# Patient Record
Sex: Male | Born: 1945 | Race: White | Hispanic: No | Smoking: Current every day smoker
Health system: Southern US, Community
[De-identification: ages and names within clinical notes are randomized; demographics above are authoritative.]

## PROBLEM LIST (undated history)

## (undated) DIAGNOSIS — J449 Chronic obstructive pulmonary disease, unspecified: Secondary | ICD-10-CM

## (undated) DIAGNOSIS — I10 Essential (primary) hypertension: Secondary | ICD-10-CM

## (undated) HISTORY — PX: APPENDECTOMY: SHX54

## (undated) HISTORY — PX: NECK SURGERY: SHX720

## (undated) HISTORY — PX: HEMORRHOID SURGERY: SHX153

---

## 2020-01-16 DIAGNOSIS — R7303 Prediabetes: Secondary | ICD-10-CM | POA: Diagnosis not present

## 2020-01-16 DIAGNOSIS — I209 Angina pectoris, unspecified: Secondary | ICD-10-CM | POA: Diagnosis not present

## 2020-01-16 DIAGNOSIS — J449 Chronic obstructive pulmonary disease, unspecified: Secondary | ICD-10-CM | POA: Diagnosis not present

## 2020-01-16 DIAGNOSIS — I10 Essential (primary) hypertension: Secondary | ICD-10-CM | POA: Diagnosis not present

## 2020-01-20 DIAGNOSIS — I10 Essential (primary) hypertension: Secondary | ICD-10-CM | POA: Insufficient documentation

## 2020-01-20 DIAGNOSIS — E785 Hyperlipidemia, unspecified: Secondary | ICD-10-CM | POA: Diagnosis not present

## 2020-01-20 DIAGNOSIS — I25118 Atherosclerotic heart disease of native coronary artery with other forms of angina pectoris: Secondary | ICD-10-CM | POA: Diagnosis not present

## 2020-01-20 DIAGNOSIS — Z720 Tobacco use: Secondary | ICD-10-CM | POA: Diagnosis not present

## 2020-01-24 DIAGNOSIS — R079 Chest pain, unspecified: Secondary | ICD-10-CM | POA: Diagnosis not present

## 2020-01-24 DIAGNOSIS — Z8249 Family history of ischemic heart disease and other diseases of the circulatory system: Secondary | ICD-10-CM | POA: Diagnosis not present

## 2020-01-24 DIAGNOSIS — I25118 Atherosclerotic heart disease of native coronary artery with other forms of angina pectoris: Secondary | ICD-10-CM | POA: Diagnosis not present

## 2020-02-06 DIAGNOSIS — Z8249 Family history of ischemic heart disease and other diseases of the circulatory system: Secondary | ICD-10-CM | POA: Diagnosis not present

## 2020-02-06 DIAGNOSIS — I25118 Atherosclerotic heart disease of native coronary artery with other forms of angina pectoris: Secondary | ICD-10-CM | POA: Diagnosis not present

## 2020-02-06 DIAGNOSIS — R0602 Shortness of breath: Secondary | ICD-10-CM | POA: Diagnosis not present

## 2020-02-06 DIAGNOSIS — I10 Essential (primary) hypertension: Secondary | ICD-10-CM | POA: Diagnosis not present

## 2020-02-17 DIAGNOSIS — E785 Hyperlipidemia, unspecified: Secondary | ICD-10-CM | POA: Diagnosis not present

## 2020-02-17 DIAGNOSIS — I25118 Atherosclerotic heart disease of native coronary artery with other forms of angina pectoris: Secondary | ICD-10-CM | POA: Diagnosis not present

## 2020-02-17 DIAGNOSIS — I10 Essential (primary) hypertension: Secondary | ICD-10-CM | POA: Diagnosis not present

## 2020-04-22 DIAGNOSIS — Z0000 Encounter for general adult medical examination without abnormal findings: Secondary | ICD-10-CM | POA: Diagnosis not present

## 2020-04-22 DIAGNOSIS — I209 Angina pectoris, unspecified: Secondary | ICD-10-CM | POA: Diagnosis not present

## 2020-04-22 DIAGNOSIS — I10 Essential (primary) hypertension: Secondary | ICD-10-CM | POA: Diagnosis not present

## 2020-04-22 DIAGNOSIS — J449 Chronic obstructive pulmonary disease, unspecified: Secondary | ICD-10-CM | POA: Diagnosis not present

## 2020-04-28 DIAGNOSIS — Z122 Encounter for screening for malignant neoplasm of respiratory organs: Secondary | ICD-10-CM | POA: Diagnosis not present

## 2020-04-28 DIAGNOSIS — F17210 Nicotine dependence, cigarettes, uncomplicated: Secondary | ICD-10-CM | POA: Diagnosis not present

## 2020-05-15 DIAGNOSIS — R911 Solitary pulmonary nodule: Secondary | ICD-10-CM | POA: Diagnosis not present

## 2020-05-15 DIAGNOSIS — J410 Simple chronic bronchitis: Secondary | ICD-10-CM | POA: Diagnosis not present

## 2020-05-15 DIAGNOSIS — J45909 Unspecified asthma, uncomplicated: Secondary | ICD-10-CM | POA: Insufficient documentation

## 2020-05-15 DIAGNOSIS — Z720 Tobacco use: Secondary | ICD-10-CM | POA: Diagnosis not present

## 2020-05-27 DIAGNOSIS — J410 Simple chronic bronchitis: Secondary | ICD-10-CM | POA: Insufficient documentation

## 2020-07-23 DIAGNOSIS — Z0000 Encounter for general adult medical examination without abnormal findings: Secondary | ICD-10-CM | POA: Diagnosis not present

## 2020-07-23 DIAGNOSIS — I10 Essential (primary) hypertension: Secondary | ICD-10-CM | POA: Diagnosis not present

## 2020-07-23 DIAGNOSIS — E782 Mixed hyperlipidemia: Secondary | ICD-10-CM | POA: Diagnosis not present

## 2020-07-23 DIAGNOSIS — J449 Chronic obstructive pulmonary disease, unspecified: Secondary | ICD-10-CM | POA: Diagnosis not present

## 2020-07-25 DIAGNOSIS — I10 Essential (primary) hypertension: Secondary | ICD-10-CM | POA: Diagnosis not present

## 2020-07-25 DIAGNOSIS — J449 Chronic obstructive pulmonary disease, unspecified: Secondary | ICD-10-CM | POA: Diagnosis not present

## 2020-07-25 DIAGNOSIS — E782 Mixed hyperlipidemia: Secondary | ICD-10-CM | POA: Diagnosis not present

## 2020-07-31 DIAGNOSIS — J439 Emphysema, unspecified: Secondary | ICD-10-CM | POA: Diagnosis not present

## 2020-07-31 DIAGNOSIS — J432 Centrilobular emphysema: Secondary | ICD-10-CM | POA: Diagnosis not present

## 2020-07-31 DIAGNOSIS — Z720 Tobacco use: Secondary | ICD-10-CM | POA: Diagnosis not present

## 2020-07-31 DIAGNOSIS — I2510 Atherosclerotic heart disease of native coronary artery without angina pectoris: Secondary | ICD-10-CM | POA: Diagnosis not present

## 2020-07-31 DIAGNOSIS — R911 Solitary pulmonary nodule: Secondary | ICD-10-CM | POA: Diagnosis not present

## 2020-07-31 DIAGNOSIS — I700 Atherosclerosis of aorta: Secondary | ICD-10-CM | POA: Diagnosis not present

## 2020-08-04 DIAGNOSIS — R911 Solitary pulmonary nodule: Secondary | ICD-10-CM | POA: Diagnosis not present

## 2020-08-04 DIAGNOSIS — Z720 Tobacco use: Secondary | ICD-10-CM | POA: Diagnosis not present

## 2020-08-04 DIAGNOSIS — U070 Vaping-related disorder: Secondary | ICD-10-CM | POA: Diagnosis not present

## 2020-08-06 DIAGNOSIS — H2513 Age-related nuclear cataract, bilateral: Secondary | ICD-10-CM | POA: Diagnosis not present

## 2020-08-24 DIAGNOSIS — J449 Chronic obstructive pulmonary disease, unspecified: Secondary | ICD-10-CM | POA: Diagnosis not present

## 2020-08-24 DIAGNOSIS — E782 Mixed hyperlipidemia: Secondary | ICD-10-CM | POA: Diagnosis not present

## 2020-08-24 DIAGNOSIS — I10 Essential (primary) hypertension: Secondary | ICD-10-CM | POA: Diagnosis not present

## 2020-09-01 DIAGNOSIS — Z720 Tobacco use: Secondary | ICD-10-CM | POA: Diagnosis not present

## 2020-09-01 DIAGNOSIS — I25118 Atherosclerotic heart disease of native coronary artery with other forms of angina pectoris: Secondary | ICD-10-CM | POA: Diagnosis not present

## 2020-09-01 DIAGNOSIS — I10 Essential (primary) hypertension: Secondary | ICD-10-CM | POA: Diagnosis not present

## 2020-09-01 DIAGNOSIS — E785 Hyperlipidemia, unspecified: Secondary | ICD-10-CM | POA: Diagnosis not present

## 2020-10-24 DIAGNOSIS — I10 Essential (primary) hypertension: Secondary | ICD-10-CM | POA: Diagnosis not present

## 2020-10-24 DIAGNOSIS — E782 Mixed hyperlipidemia: Secondary | ICD-10-CM | POA: Diagnosis not present

## 2020-10-24 DIAGNOSIS — J449 Chronic obstructive pulmonary disease, unspecified: Secondary | ICD-10-CM | POA: Diagnosis not present

## 2020-11-02 DIAGNOSIS — E782 Mixed hyperlipidemia: Secondary | ICD-10-CM | POA: Diagnosis not present

## 2020-11-02 DIAGNOSIS — R7303 Prediabetes: Secondary | ICD-10-CM | POA: Diagnosis not present

## 2020-11-02 DIAGNOSIS — I10 Essential (primary) hypertension: Secondary | ICD-10-CM | POA: Diagnosis not present

## 2020-11-02 DIAGNOSIS — Z0000 Encounter for general adult medical examination without abnormal findings: Secondary | ICD-10-CM | POA: Diagnosis not present

## 2020-11-02 DIAGNOSIS — J449 Chronic obstructive pulmonary disease, unspecified: Secondary | ICD-10-CM | POA: Diagnosis not present

## 2020-11-23 DIAGNOSIS — E782 Mixed hyperlipidemia: Secondary | ICD-10-CM | POA: Diagnosis not present

## 2020-11-23 DIAGNOSIS — I10 Essential (primary) hypertension: Secondary | ICD-10-CM | POA: Diagnosis not present

## 2020-11-23 DIAGNOSIS — J449 Chronic obstructive pulmonary disease, unspecified: Secondary | ICD-10-CM | POA: Diagnosis not present

## 2020-12-24 DIAGNOSIS — I10 Essential (primary) hypertension: Secondary | ICD-10-CM | POA: Diagnosis not present

## 2020-12-24 DIAGNOSIS — J449 Chronic obstructive pulmonary disease, unspecified: Secondary | ICD-10-CM | POA: Diagnosis not present

## 2020-12-24 DIAGNOSIS — E782 Mixed hyperlipidemia: Secondary | ICD-10-CM | POA: Diagnosis not present

## 2021-01-24 DIAGNOSIS — I10 Essential (primary) hypertension: Secondary | ICD-10-CM | POA: Diagnosis not present

## 2021-01-24 DIAGNOSIS — J449 Chronic obstructive pulmonary disease, unspecified: Secondary | ICD-10-CM | POA: Diagnosis not present

## 2021-01-24 DIAGNOSIS — E782 Mixed hyperlipidemia: Secondary | ICD-10-CM | POA: Diagnosis not present

## 2021-02-01 DIAGNOSIS — R911 Solitary pulmonary nodule: Secondary | ICD-10-CM | POA: Diagnosis not present

## 2021-02-01 DIAGNOSIS — I700 Atherosclerosis of aorta: Secondary | ICD-10-CM | POA: Diagnosis not present

## 2021-02-01 DIAGNOSIS — J410 Simple chronic bronchitis: Secondary | ICD-10-CM | POA: Diagnosis not present

## 2021-02-01 DIAGNOSIS — J439 Emphysema, unspecified: Secondary | ICD-10-CM | POA: Diagnosis not present

## 2021-02-10 DIAGNOSIS — E782 Mixed hyperlipidemia: Secondary | ICD-10-CM | POA: Diagnosis not present

## 2021-02-10 DIAGNOSIS — Z0000 Encounter for general adult medical examination without abnormal findings: Secondary | ICD-10-CM | POA: Diagnosis not present

## 2021-02-10 DIAGNOSIS — J449 Chronic obstructive pulmonary disease, unspecified: Secondary | ICD-10-CM | POA: Diagnosis not present

## 2021-02-10 DIAGNOSIS — I10 Essential (primary) hypertension: Secondary | ICD-10-CM | POA: Diagnosis not present

## 2021-02-10 DIAGNOSIS — I700 Atherosclerosis of aorta: Secondary | ICD-10-CM | POA: Diagnosis not present

## 2021-02-24 DIAGNOSIS — I700 Atherosclerosis of aorta: Secondary | ICD-10-CM | POA: Diagnosis not present

## 2021-02-24 DIAGNOSIS — E782 Mixed hyperlipidemia: Secondary | ICD-10-CM | POA: Diagnosis not present

## 2021-02-24 DIAGNOSIS — I10 Essential (primary) hypertension: Secondary | ICD-10-CM | POA: Diagnosis not present

## 2021-02-24 DIAGNOSIS — J449 Chronic obstructive pulmonary disease, unspecified: Secondary | ICD-10-CM | POA: Diagnosis not present

## 2021-03-26 DIAGNOSIS — I10 Essential (primary) hypertension: Secondary | ICD-10-CM | POA: Diagnosis not present

## 2021-03-26 DIAGNOSIS — J449 Chronic obstructive pulmonary disease, unspecified: Secondary | ICD-10-CM | POA: Diagnosis not present

## 2021-03-26 DIAGNOSIS — E782 Mixed hyperlipidemia: Secondary | ICD-10-CM | POA: Diagnosis not present

## 2021-03-26 DIAGNOSIS — I700 Atherosclerosis of aorta: Secondary | ICD-10-CM | POA: Diagnosis not present

## 2021-04-28 DIAGNOSIS — Z9889 Other specified postprocedural states: Secondary | ICD-10-CM | POA: Diagnosis not present

## 2021-04-28 DIAGNOSIS — M542 Cervicalgia: Secondary | ICD-10-CM | POA: Diagnosis not present

## 2021-05-13 DIAGNOSIS — Z0000 Encounter for general adult medical examination without abnormal findings: Secondary | ICD-10-CM | POA: Diagnosis not present

## 2021-05-13 DIAGNOSIS — S134XXA Sprain of ligaments of cervical spine, initial encounter: Secondary | ICD-10-CM | POA: Diagnosis not present

## 2021-05-13 DIAGNOSIS — J449 Chronic obstructive pulmonary disease, unspecified: Secondary | ICD-10-CM | POA: Diagnosis not present

## 2021-05-13 DIAGNOSIS — E782 Mixed hyperlipidemia: Secondary | ICD-10-CM | POA: Diagnosis not present

## 2021-05-13 DIAGNOSIS — I10 Essential (primary) hypertension: Secondary | ICD-10-CM | POA: Diagnosis not present

## 2021-05-13 DIAGNOSIS — I700 Atherosclerosis of aorta: Secondary | ICD-10-CM | POA: Diagnosis not present

## 2021-05-26 DIAGNOSIS — I700 Atherosclerosis of aorta: Secondary | ICD-10-CM | POA: Diagnosis not present

## 2021-05-26 DIAGNOSIS — E782 Mixed hyperlipidemia: Secondary | ICD-10-CM | POA: Diagnosis not present

## 2021-05-26 DIAGNOSIS — I10 Essential (primary) hypertension: Secondary | ICD-10-CM | POA: Diagnosis not present

## 2021-05-26 DIAGNOSIS — J449 Chronic obstructive pulmonary disease, unspecified: Secondary | ICD-10-CM | POA: Diagnosis not present

## 2021-08-03 DIAGNOSIS — I700 Atherosclerosis of aorta: Secondary | ICD-10-CM | POA: Diagnosis not present

## 2021-08-03 DIAGNOSIS — F17210 Nicotine dependence, cigarettes, uncomplicated: Secondary | ICD-10-CM | POA: Diagnosis not present

## 2021-08-03 DIAGNOSIS — J439 Emphysema, unspecified: Secondary | ICD-10-CM | POA: Diagnosis not present

## 2021-08-03 DIAGNOSIS — R911 Solitary pulmonary nodule: Secondary | ICD-10-CM | POA: Diagnosis not present

## 2021-08-19 DIAGNOSIS — E782 Mixed hyperlipidemia: Secondary | ICD-10-CM | POA: Diagnosis not present

## 2021-08-19 DIAGNOSIS — S134XXA Sprain of ligaments of cervical spine, initial encounter: Secondary | ICD-10-CM | POA: Diagnosis not present

## 2021-08-19 DIAGNOSIS — J449 Chronic obstructive pulmonary disease, unspecified: Secondary | ICD-10-CM | POA: Diagnosis not present

## 2021-08-19 DIAGNOSIS — Z0000 Encounter for general adult medical examination without abnormal findings: Secondary | ICD-10-CM | POA: Diagnosis not present

## 2021-08-19 DIAGNOSIS — I10 Essential (primary) hypertension: Secondary | ICD-10-CM | POA: Diagnosis not present

## 2021-08-19 DIAGNOSIS — I700 Atherosclerosis of aorta: Secondary | ICD-10-CM | POA: Diagnosis not present

## 2021-08-24 DIAGNOSIS — E782 Mixed hyperlipidemia: Secondary | ICD-10-CM | POA: Diagnosis not present

## 2021-08-24 DIAGNOSIS — I10 Essential (primary) hypertension: Secondary | ICD-10-CM | POA: Diagnosis not present

## 2021-08-24 DIAGNOSIS — J449 Chronic obstructive pulmonary disease, unspecified: Secondary | ICD-10-CM | POA: Diagnosis not present

## 2021-08-24 DIAGNOSIS — I700 Atherosclerosis of aorta: Secondary | ICD-10-CM | POA: Diagnosis not present

## 2021-09-16 ENCOUNTER — Encounter: Payer: Self-pay | Admitting: Internal Medicine

## 2021-09-16 ENCOUNTER — Other Ambulatory Visit: Payer: Self-pay

## 2021-09-16 ENCOUNTER — Ambulatory Visit (INDEPENDENT_AMBULATORY_CARE_PROVIDER_SITE_OTHER): Payer: Medicare Other | Admitting: Internal Medicine

## 2021-09-16 DIAGNOSIS — F17210 Nicotine dependence, cigarettes, uncomplicated: Secondary | ICD-10-CM | POA: Diagnosis not present

## 2021-09-16 DIAGNOSIS — J449 Chronic obstructive pulmonary disease, unspecified: Secondary | ICD-10-CM | POA: Diagnosis not present

## 2021-09-16 MED ORDER — AZITHROMYCIN 250 MG PO TABS: ORAL_TABLET | ORAL | 11 refills | Status: DC

## 2021-09-16 NOTE — Assessment & Plan Note (Addendum)
Counseled re importance of smoking cessation but did not meet time criteria for separate billing   ? ?Ct's reviewed > agree with continuing low dose surveillance q 12 m, nothing needed sooner  ? ?Each maintenance medication was reviewed in detail including emphasizing most importantly the difference between maintenance and prns and under what circumstances the prns are to be triggered using an action plan format where appropriate. ? ?Total time for H and P, chart review, counseling, reviewing dpi device(s) , directly observing portions of ambulatory 02 saturation study/ and generating customized AVS unique to this office visit / same day charting = > 45  min for 1st evaluation  ?     ?  ?      ? ? ? ?

## 2021-09-16 NOTE — Progress Notes (Addendum)
? ?Paul Randall, male    DOB: 1945-11-06,    MRN: [id number] ? ? 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  ? ?When moved to [place] weighed 140 s sob  ? ?History of Present Illness  ?09/16/2021  Pulmonary/ 1st office eval/ Paul Randall / [facility]  ?Chief Complaint  ?Patient presents with  ? Consult  ?  Patient had CT done and nodules noted. Brought CT CD   ?Dyspnea:  onset with wt gain - walks across parking lot/ no problem slow pace, main problem is when bend over  - ex tol better p trelegy  ?Cough: some with ex / variably discolored never bloody  ?Sleep: insomnia/ flat bed / 2 pillows  ?SABA use: spiriva respimat > not helpful  ? ?No obvious day to day or daytime variability or assoc excess/ purulent sputum or mucus plugs or hemoptysis or cp or chest tightness, subjective wheeze or overt sinus or hb symptoms.  ? ?Sleeping  without nocturnal  or early am exacerbation  of respiratory  c/o's or need for noct saba. Also denies any obvious fluctuation of symptoms with weather or environmental changes or other aggravating or alleviating factors except as outlined above  ? ?No unusual exposure hx or h/o childhood pna/ asthma or knowledge of premature birth. ? ?Current Allergies, Complete Past Medical History, Past Surgical History, Family History, and Social History were reviewed in [employer] record. ? ?ROS  The following are not active complaints unless bolded ?Hoarseness, sore throat, dysphagia, dental problems, itching, sneezing,  nasal congestion or discharge of excess mucus or purulent secretions, ear ache,   fever, chills, sweats, unintended wt loss or wt gain, classically pleuritic or exertional cp,  orthopnea pnd or arm/hand swelling  or leg swelling, presyncope, palpitations, abdominal pain, anorexia, nausea, vomiting, diarrhea  or change in bowel habits  or change in bladder habits, change in stools or change in urine, dysuria, hematuria,  rash, arthralgias, visual complaints, headache, numbness, weakness or ataxia or problems with walking or coordination,  change in mood or  memory. ?      ?   ? ?No past medical history on file. ? ?Outpatient Medications Prior to Visit  ?Medication Sig Dispense Refill  ? aspirin 81 MG EC tablet Take by mouth.    ? atorvastatin (LIPITOR) 40 MG tablet Take 40 mg by mouth daily.    ? isosorbide mononitrate (IMDUR) 30 MG 24 hr tablet Take 30 mg by mouth daily.    ? loratadine (CLARITIN) 10 MG tablet Take 10 mg by mouth daily.    ? metoprolol succinate (TOPROL-XL) 25 MG 24 hr tablet Take 25 mg by mouth daily.    ? naproxen sodium (ALEVE) 220 MG tablet Take 220 mg by mouth.    ? TRELEGY ELLIPTA 200-62.5-25 MCG/ACT AEPB Inhale 1 puff into the lungs daily.    ? ?No facility-administered medications prior to visit.  ? ? ? ?Objective:  ?  ? ?BP 138/70 (BP Location: Left Arm, Patient Position: Sitting)   Pulse 78   Temp 98.1 ?F (36.7 ?C) (Temporal)   Ht 5\' 7"  (1.702 m)   Wt 197 lb 6.4 oz (89.5 kg)   SpO2 98% Comment: ra  BMI 30.92 kg/m?  ? ?SpO2: 98 % (ra) ? ?Amb wm nad congested sounding cough  ? ?HEENT : pt wearing mask not  removed for exam due to covid -19 concerns.  ? ? ?NECK :  without JVD/Nodes/TM/ nl carotid upstrokes bilaterally ? ? ?LUNGS: no acc muscle use,  Mod barrel  contour chest wall with bilateral  Distant bs s audible wheeze and  without cough on insp or exp maneuvers and mod  Hyperresonant  to  percussion bilaterally   ? ? ?CV:  RRR  no s3 or murmur or increase in P2, and no edema  ? ?ABD:  soft and nontender with pos mid insp Hoover's  in the supine position. No bruits or organomegaly appreciated, bowel sounds nl ? ?MS:     ext warm without deformities, calf tenderness, cyanosis or clubbing ?No obvious joint restrictions  ? ?SKIN: warm and dry without lesions   ? ?NEURO:  alert, approp, nl sensorium with  no motor or  cerebellar deficits apparent.  ?    ? ? ?I personally reviewed images and agree with radiology impression as follows:  ? Chest LDSCT  08/03/21 ?Biapical pleuroparenchymal scarring. Centrilobular and  ?paraseptal emphysema. Smoking related respiratory bronchiolitis. Mid  ?and lower lung zone predominant peribronchovascular nodularity with  ?interval progression from 02/01/2021. Some previously measured  ?nodules are no longer present. Nodular densities measure 9.8 mm or  ?less in size. No pleural fluid. Airway is unremarkable.  ?   ?Assessment  ? ?COPD  GOLD ? / still smoking with emphysema on CT ?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  08/03/21  ?- 09/16/2021  After extensive coaching inhaler device,  effectiveness =    90% > continue trelegy 100 q am  ? ?Clinically at least moderate copd and already on trelegy presumed group D symptom/ risk so nothing more to add at this point but needs pfts and alpha testing on return  ? ? ? ? ? ?Cigarette smoker ?Counseled re importance of smoking cessation but did not meet time criteria for separate billing   ? ?Ct's reviewed > agree with continuing low dose surveillance q 12 m, nothing needed sooner  ? ?Each maintenance medication was reviewed in detail including emphasizing most importantly the difference between maintenance and prns and under what circumstances the prns are to be triggered using an action plan format where appropriate. ? ?Total time for H and P, chart review, counseling, reviewing dpi device(s) , directly observing portions of ambulatory 02 saturation study/ and generating customized AVS unique to this office visit / same day charting = > 45  min for 1st evaluation  ?     ?  ?      ? ? ? ? ? ? ?Paul Gully, MD ?09/16/2021 ?   ?

## 2021-09-16 NOTE — Patient Instructions (Signed)
The key is to stop smoking completely before smoking completely stops you! ? ? For nasty mucus > zpak  (refillable) ? ?For thick mucus/ congested cough >  add mucinex dm (over the counter) 1200 mg every 12 hours as needed  ? ?We will walk you here for a baseline ? ?No change trelegy once daily  ? ?We will see you back here after you have PFTs at [facility]  ? ? ?

## 2021-09-16 NOTE — Assessment & Plan Note (Addendum)
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  08/03/21  ?- 09/16/2021  After extensive coaching inhaler device,  effectiveness =    90% > continue trelegy 100 q am  ? ?Clinically at least moderate copd and already on trelegy presumed group D symptom/ risk so nothing more to add at this point but needs pfts and alpha testing on return  ? ? ? ? ?

## 2021-10-26 ENCOUNTER — Encounter (HOSPITAL_COMMUNITY): Payer: Self-pay | Admitting: Emergency Medicine

## 2021-10-26 ENCOUNTER — Emergency Department (HOSPITAL_COMMUNITY): Payer: Medicare Other

## 2021-10-26 ENCOUNTER — Emergency Department (HOSPITAL_COMMUNITY)
Admission: EM | Admit: 2021-10-26 | Discharge: 2021-10-26 | Disposition: A | Discharge status: Home / Self Care | Payer: Medicare Other | Attending: Emergency Medicine | Admitting: Emergency Medicine

## 2021-10-26 ENCOUNTER — Other Ambulatory Visit: Payer: Self-pay

## 2021-10-26 DIAGNOSIS — I10 Essential (primary) hypertension: Secondary | ICD-10-CM | POA: Insufficient documentation

## 2021-10-26 DIAGNOSIS — Z79899 Other long term (current) drug therapy: Secondary | ICD-10-CM | POA: Insufficient documentation

## 2021-10-26 DIAGNOSIS — R140 Abdominal distension (gaseous): Secondary | ICD-10-CM | POA: Insufficient documentation

## 2021-10-26 DIAGNOSIS — Z7982 Long term (current) use of aspirin: Secondary | ICD-10-CM | POA: Diagnosis not present

## 2021-10-26 DIAGNOSIS — J9811 Atelectasis: Secondary | ICD-10-CM | POA: Diagnosis not present

## 2021-10-26 DIAGNOSIS — I700 Atherosclerosis of aorta: Secondary | ICD-10-CM | POA: Diagnosis not present

## 2021-10-26 DIAGNOSIS — Z87891 Personal history of nicotine dependence: Secondary | ICD-10-CM | POA: Diagnosis not present

## 2021-10-26 DIAGNOSIS — R079 Chest pain, unspecified: Secondary | ICD-10-CM | POA: Insufficient documentation

## 2021-10-26 DIAGNOSIS — R109 Unspecified abdominal pain: Secondary | ICD-10-CM | POA: Diagnosis not present

## 2021-10-26 DIAGNOSIS — J449 Chronic obstructive pulmonary disease, unspecified: Secondary | ICD-10-CM | POA: Insufficient documentation

## 2021-10-26 DIAGNOSIS — R0789 Other chest pain: Secondary | ICD-10-CM | POA: Diagnosis not present

## 2021-10-26 HISTORY — DX: Essential (primary) hypertension: I10

## 2021-10-26 LAB — CBC
HCT: 50.2 % (ref 39.0–52.0)
Hemoglobin: 16.5 g/dL (ref 13.0–17.0)
MCH: 31.4 pg (ref 26.0–34.0)
MCHC: 32.9 g/dL (ref 30.0–36.0)
MCV: 95.4 fL (ref 80.0–100.0)
Platelets: 174 10*3/uL (ref 150–400)
RBC: 5.26 MIL/uL (ref 4.22–5.81)
RDW: 13.2 % (ref 11.5–15.5)
WBC: 9 10*3/uL (ref 4.0–10.5)
nRBC: 0 % (ref 0.0–0.2)

## 2021-10-26 LAB — HEPATIC FUNCTION PANEL
ALT: 45 U/L — ABNORMAL HIGH (ref 0–44)
AST: 26 U/L (ref 15–41)
Albumin: 4.3 g/dL (ref 3.5–5.0)
Alkaline Phosphatase: 102 U/L (ref 38–126)
Bilirubin, Direct: 0.1 mg/dL (ref 0.0–0.2)
Indirect Bilirubin: 0.5 mg/dL (ref 0.3–0.9)
Total Bilirubin: 0.6 mg/dL (ref 0.3–1.2)
Total Protein: 7.4 g/dL (ref 6.5–8.1)

## 2021-10-26 LAB — BASIC METABOLIC PANEL WITH GFR
Anion gap: 5 (ref 5–15)
BUN: 12 mg/dL (ref 8–23)
CO2: 29 mmol/L (ref 22–32)
Calcium: 8.9 mg/dL (ref 8.9–10.3)
Chloride: 105 mmol/L (ref 98–111)
Creatinine, Ser: 1.18 mg/dL (ref 0.61–1.24)
GFR, Estimated: >60 mL/min
Glucose, Bld: 101 mg/dL — ABNORMAL HIGH (ref 70–99)
Potassium: 4.6 mmol/L (ref 3.5–5.1)
Sodium: 139 mmol/L (ref 135–145)

## 2021-10-26 LAB — LIPASE, BLOOD: Lipase: 37 U/L (ref 11–51)

## 2021-10-26 LAB — TROPONIN I (HIGH SENSITIVITY)
Troponin I (High Sensitivity): 3 ng/L (ref <18)
Troponin I (High Sensitivity): 4 ng/L (ref <18)

## 2021-10-26 MED ORDER — OMEPRAZOLE 20 MG PO CPDR: 20.0000 mg | DELAYED_RELEASE_CAPSULE | Freq: Every day | ORAL | 0 refills | Status: AC

## 2021-10-26 MED ORDER — FAMOTIDINE 20 MG PO TABS
40.0000 mg | ORAL_TABLET | Freq: Once | ORAL | Status: AC
  Administered 2021-10-26: 40 mg via ORAL
  Filled 2021-10-26: qty 2

## 2021-10-26 MED ORDER — IOHEXOL 300 MG/ML  SOLN
100.0000 mL | Freq: Once | INTRAMUSCULAR | Status: AC | PRN
  Administered 2021-10-26: 100 mL via INTRAVENOUS

## 2021-10-26 NOTE — Discharge Instructions (Signed)
As discussed, your symptoms do not sound to be cardiac in nature which was confirmed by your work-up today.  It sounds like you are having increased acid reflux symptoms.  I am prescribing you medication to help you with that symptom.  You may continue using your Tums if needed for breakthrough reflux symptoms additionally, although once the new medication is effective acute may not need Tums.  You may also benefit by seeing a gastroenterologist given the acid reflux and your abdominal distention.  Your CT scan is reassuring today, but a GI specialist may be able to help you better control the symptoms.  You may call Dr. Earmon Phoenix if you choose to pursue this evaluation. ?

## 2021-10-26 NOTE — ED Triage Notes (Signed)
Pt having intermittent chest pain x1 week, stop smoking last week, currently using nicotine patch, history of HTN. ?

## 2021-10-26 NOTE — ED Provider Notes (Signed)
?[facility] ?Provider Note ? ? ?CSN: [id number] ?Arrival date & time: 10/26/21  1254 ? ?  ? ?History ? ?Chief Complaint  ?Patient presents with  ? Chest Pain  ? ? ?Paul Randall is a 76 y.o. male with a history significant for hypertension and COPD who just stopped smoking 1 week ago presenting for evaluation of a 1 week history of intermittent chest pain since smoking cessation.  He describes intermittent stabs of pain which occur randomly, sometimes in his right, other times in his left chest but also describes pain in his upper abdomen at times as well.  He also notes that he has been having increasing abdominal distention and discomfort and bloating.  He denies nausea or vomiting, denies constipation, bloody stools, dysuria, also no fevers or chills.  He does report increased acid reflux symptoms, especially when he is lying down at night.  He takes Tums on a regular basis which typically will help this symptom.  He denies shortness of breath and denies pleuritic pain.  He states sometimes the stabs of pain can be triggered by movement but not consistently.  He has found no alleviators for the symptoms.  ? ?The history is provided by the patient.  ? ?  ? ?Home Medications ?Prior to Admission medications   ?Medication Sig Start Date End Date Taking? Authorizing Provider  ?omeprazole (PRILOSEC) 20 MG capsule Take 1 capsule (20 mg total) by mouth daily. 10/26/21  Yes IdolAlmyra Free, PA-C  ?aspirin 81 MG EC tablet Take by mouth.    [provider]  ?atorvastatin (LIPITOR) 40 MG tablet Take 40 mg by mouth daily. 09/02/21   [provider]  ?azithromycin (ZITHROMAX) 250 MG tablet Take 2 on day one then 1 daily x 4 days 09/16/21   Tanda Rockers, MD  ?isosorbide mononitrate (IMDUR) 30 MG 24 hr tablet Take 30 mg by mouth daily. 09/06/21   [provider]  ?loratadine (CLARITIN) 10 MG tablet Take 10 mg by mouth daily.    [provider]  ?metoprolol succinate (TOPROL-XL) 25  MG 24 hr tablet Take 25 mg by mouth daily. 09/07/21   [provider]  ?naproxen sodium (ALEVE) 220 MG tablet Take 220 mg by mouth.    [provider]  ?Donnal Debar 200-62.5-25 MCG/ACT AEPB Inhale 1 puff into the lungs daily. 08/10/21   [provider]  ?   ? ?Allergies    ?Patient has no known allergies.   ? ?Review of Systems   ?Review of Systems  ?Constitutional:  Negative for chills and fever.  ?HENT:  Negative for congestion.   ?Eyes: Negative.   ?Respiratory:  Negative for chest tightness and shortness of breath.   ?Cardiovascular:  Positive for chest pain.  ?Gastrointestinal:  Positive for abdominal distention and abdominal pain. Negative for constipation, diarrhea, nausea and vomiting.  ?Genitourinary: Negative.   ?Musculoskeletal:  Negative for arthralgias, joint swelling and neck pain.  ?Skin: Negative.  Negative for rash and wound.  ?Neurological:  Negative for dizziness, weakness, light-headedness, numbness and headaches.  ?Psychiatric/Behavioral: Negative.    ?All other systems reviewed and are negative. ? ?Physical Exam ?Updated Vital Signs ?BP 125/75   Pulse 76   Temp 97.8 ?F (36.6 ?C) (Oral)   Resp (!) 21   Ht 5\' 7"  (1.702 m)   Wt 88.9 kg   SpO2 93%   BMI 30.70 kg/m?  ?Physical Exam ?Vitals and nursing note reviewed.  ?Constitutional:   ?   Appearance:  He is well-developed.  ?HENT:  ?   Head: Normocephalic and atraumatic.  ?Eyes:  ?   Conjunctiva/sclera: Conjunctivae normal.  ?Cardiovascular:  ?   Rate and Rhythm: Normal rate and regular rhythm.  ?   Heart sounds: Normal heart sounds.  ?Pulmonary:  ?   Effort: Pulmonary effort is normal. No respiratory distress.  ?   Breath sounds: Normal breath sounds. No wheezing, rhonchi or rales.  ?Abdominal:  ?   General: Bowel sounds are normal. There is distension.  ?   Palpations: Abdomen is soft. There is no hepatomegaly or splenomegaly.  ?   Tenderness: There is no abdominal tenderness.  ?   Comments: No increased tympany  to percussion.  Non tender abdomen but with significant distention.  ?Musculoskeletal:     ?   General: Normal range of motion.  ?   Cervical back: Normal range of motion.  ?   Right lower leg: No edema.  ?   Left lower leg: No edema.  ?Skin: ?   General: Skin is warm and dry.  ?Neurological:  ?   General: No focal deficit present.  ?   Mental Status: He is alert.  ? ? ?ED Results / Procedures / Treatments   ?Labs ?(all labs ordered are listed, but only abnormal results are displayed) ?Labs Reviewed  ?BASIC METABOLIC PANEL - Abnormal; Notable for the following components:  ?    Result Value  ? Glucose, Bld 101 (*)   ? All other components within normal limits  ?HEPATIC FUNCTION PANEL - Abnormal; Notable for the following components:  ? ALT 45 (*)   ? All other components within normal limits  ?CBC  ?LIPASE, BLOOD  ?TROPONIN I (HIGH SENSITIVITY)  ?TROPONIN I (HIGH SENSITIVITY)  ? ? ?EKG ?EKG Interpretation ? ?Date/Time:  Tuesday Oct 26 2021 13:18:02 EDT ?Ventricular Rate:  84 ?PR Interval:  188 ?QRS Duration: 90 ?QT Interval:  382 ?QTC Calculation: 451 ?R Axis:   6 ?Text Interpretation: Sinus rhythm with frequent and consecutive Premature ventricular complexes Abnormal ECG No previous ECGs available Confirmed by Gloris Manchesterixon, Ryan [phone] on 10/26/2021 6:40:23 PM ? ?Radiology ?DG Chest 2 View ? ?Result Date: 10/26/2021 ?CLINICAL DATA:  Chest pain. EXAM: CHEST - 2 VIEW COMPARISON:  None Available. FINDINGS: The heart size and mediastinal contours are within normal limits. Mild bibasilar subsegmental atelectasis is noted. The visualized skeletal structures are unremarkable. IMPRESSION: Mild bibasilar subsegmental atelectasis. Electronically Signed   By: Lupita RaiderJames  Green Jr M.D.   On: 10/26/2021 13:29  ? ?CT ABDOMEN PELVIS W CONTRAST ? ?Result Date: 10/26/2021 ?CLINICAL DATA:  Generalized abdominal pain with nausea EXAM: CT ABDOMEN AND PELVIS WITH CONTRAST TECHNIQUE: Multidetector CT imaging of the abdomen and pelvis was performed using  the standard protocol following bolus administration of intravenous contrast. RADIATION DOSE REDUCTION: This exam was performed according to the departmental dose-optimization program which includes automated exposure control, adjustment of the mA and/or kV according to patient size and/or use of iterative reconstruction technique. CONTRAST:  100mL OMNIPAQUE IOHEXOL 300 MG/ML  SOLN COMPARISON:  Chest x-ray 10/26/2021, chest CT 08/03/2021 FINDINGS: Lower chest: Lung bases demonstrate punctate central lobular nodules in the right middle lobe and right greater than left lung base, suggestive of atypical infection, see chest CT 08/03/2021. No consolidation or effusion. Hepatobiliary: No focal liver abnormality is seen. No gallstones, gallbladder wall thickening, or biliary dilatation. Pancreas: Unremarkable. No pancreatic ductal dilatation or surrounding inflammatory changes. Spleen: Normal in size without focal abnormality. Adrenals/Urinary Tract: Adrenal  glands are normal. Kidneys show no hydronephrosis. Subcentimeter hypodensity upper pole left kidney too small to further characterize. The bladder is normal Stomach/Bowel: The stomach is nonenlarged. No dilated small bowel. No acute bowel wall thickening. Vascular/Lymphatic: Moderate aortic atherosclerosis. No aneurysm. No suspicious lymph nodes Reproductive: Prostate is unremarkable. Other: Negative for pelvic effusion or free air Musculoskeletal: No acute osseous abnormality. There are degenerative changes of the lumbar spine IMPRESSION: 1. No CT evidence for acute intra-abdominal or pelvic abnormality. Electronically Signed   By: Donavan Foil M.D.   On: 10/26/2021 17:52   ? ?Procedures ?Procedures  ? ? ?Medications Ordered in ED ?Medications  ?famotidine (PEPCID) tablet 40 mg (has no administration in time range)  ?iohexol (OMNIPAQUE) 300 MG/ML solution 100 mL (100 mLs Intravenous Contrast Given 10/26/21 1734)  ? ? ?ED Course/ Medical Decision Making/ A&P ?  ?                         ?Medical Decision Making ?Amount and/or Complexity of Data Reviewed ?Labs: ordered. ?Radiology: ordered. ? ?Risk ?Prescription drug management. ? ? ? ? ? ? ? ? ? ? ?Final Clinical Impressio

## 2021-10-28 DIAGNOSIS — I209 Angina pectoris, unspecified: Secondary | ICD-10-CM | POA: Diagnosis not present

## 2021-10-28 DIAGNOSIS — K219 Gastro-esophageal reflux disease without esophagitis: Secondary | ICD-10-CM | POA: Diagnosis not present

## 2021-11-16 ENCOUNTER — Ambulatory Visit (HOSPITAL_COMMUNITY): Payer: Medicare Other

## 2021-11-18 ENCOUNTER — Ambulatory Visit (HOSPITAL_COMMUNITY)
Admission: RE | Admit: 2021-11-18 | Discharge: 2021-11-18 | Disposition: A | Discharge status: Home / Self Care | Payer: Medicare Other | Source: Ambulatory Visit | Attending: Internal Medicine | Admitting: Internal Medicine

## 2021-11-18 DIAGNOSIS — I700 Atherosclerosis of aorta: Secondary | ICD-10-CM | POA: Diagnosis not present

## 2021-11-18 DIAGNOSIS — I10 Essential (primary) hypertension: Secondary | ICD-10-CM | POA: Diagnosis not present

## 2021-11-18 DIAGNOSIS — Z0000 Encounter for general adult medical examination without abnormal findings: Secondary | ICD-10-CM | POA: Diagnosis not present

## 2021-11-18 DIAGNOSIS — J449 Chronic obstructive pulmonary disease, unspecified: Secondary | ICD-10-CM | POA: Insufficient documentation

## 2021-11-18 DIAGNOSIS — K219 Gastro-esophageal reflux disease without esophagitis: Secondary | ICD-10-CM | POA: Diagnosis not present

## 2021-11-18 DIAGNOSIS — E782 Mixed hyperlipidemia: Secondary | ICD-10-CM | POA: Diagnosis not present

## 2021-11-18 LAB — PULMONARY FUNCTION TEST
DL/VA % pred: 69 %
DL/VA: 2.83 ml/min/mmHg/L
DLCO unc % pred: 53 %
DLCO unc: 12.39 ml/min/mmHg
FEF 25-75 Post: 0.74 L/sec
FEF 25-75 Pre: 0.75 L/sec
FEF2575-%Change-Post: -1 %
FEF2575-%Pred-Post: 36 %
FEF2575-%Pred-Pre: 36 %
FEV1-%Change-Post: 0 %
FEV1-%Pred-Post: 69 %
FEV1-%Pred-Pre: 70 %
FEV1-Post: 1.94 L
FEV1-Pre: 1.94 L
FEV1FVC-%Change-Post: 0 %
FEV1FVC-%Pred-Pre: 69 %
FEV6-%Change-Post: 0 %
FEV6-%Pred-Post: 99 %
FEV6-%Pred-Pre: 100 %
FEV6-Post: 3.56 L
FEV6-Pre: 3.59 L
FEV6FVC-%Change-Post: 0 %
FEV6FVC-%Pred-Post: 101 %
FEV6FVC-%Pred-Pre: 100 %
FVC-%Change-Post: -1 %
FVC-%Pred-Post: 98 %
FVC-%Pred-Pre: 99 %
FVC-Post: 3.78 L
FVC-Pre: 3.82 L
Post FEV1/FVC ratio: 51 %
Post FEV6/FVC ratio: 95 %
Pre FEV1/FVC ratio: 51 %
Pre FEV6/FVC Ratio: 94 %
RV % pred: 247 %
RV: 5.82 L
TLC % pred: 153 %
TLC: 9.89 L

## 2021-11-18 MED ORDER — ALBUTEROL SULFATE (2.5 MG/3ML) 0.083% IN NEBU
2.5000 mg | INHALATION_SOLUTION | Freq: Once | RESPIRATORY_TRACT | Status: AC
  Administered 2021-11-18: 2.5 mg via RESPIRATORY_TRACT

## 2021-11-24 DIAGNOSIS — K219 Gastro-esophageal reflux disease without esophagitis: Secondary | ICD-10-CM | POA: Diagnosis not present

## 2021-11-24 DIAGNOSIS — I700 Atherosclerosis of aorta: Secondary | ICD-10-CM | POA: Diagnosis not present

## 2021-11-24 DIAGNOSIS — E782 Mixed hyperlipidemia: Secondary | ICD-10-CM | POA: Diagnosis not present

## 2021-11-24 DIAGNOSIS — I10 Essential (primary) hypertension: Secondary | ICD-10-CM | POA: Diagnosis not present

## 2021-11-24 DIAGNOSIS — J449 Chronic obstructive pulmonary disease, unspecified: Secondary | ICD-10-CM | POA: Diagnosis not present

## 2022-01-10 ENCOUNTER — Ambulatory Visit (INDEPENDENT_AMBULATORY_CARE_PROVIDER_SITE_OTHER): Payer: Medicare Other | Admitting: Internal Medicine

## 2022-01-10 ENCOUNTER — Encounter: Payer: Self-pay | Admitting: Internal Medicine

## 2022-01-10 DIAGNOSIS — J449 Chronic obstructive pulmonary disease, unspecified: Secondary | ICD-10-CM | POA: Diagnosis not present

## 2022-01-10 DIAGNOSIS — F17210 Nicotine dependence, cigarettes, uncomplicated: Secondary | ICD-10-CM | POA: Diagnosis not present

## 2022-01-10 MED ORDER — BREZTRI AEROSPHERE 160-9-4.8 MCG/ACT IN AERO: INHALATION_SPRAY | RESPIRATORY_TRACT | 11 refills | Status: AC

## 2022-01-10 NOTE — Patient Instructions (Addendum)
Stop the trelegy   Start Breztri Take 2 puffs first thing in am and then another 2 puffs about 12 hours later.    Work on inhaler technique:  relax and gently blow all the way out then take a nice smooth full deep breath back in, triggering the inhaler at same time you start breathing in.  Hold for up to 5 seconds if you can. Blow out thru nose. Rinse and gargle with water when done.  If mouth or throat bother you at all,  try brushing teeth/gums/tongue with arm and hammer toothpaste/ make a slurry and gargle and spit out.   Change omeprazole to where you take it Take 30- 60 min before your first and last meals of the day until cough is gone then back to Take 30-60 min before first meal of the day  The key is to stop smoking completely before smoking completely stops you!   Please schedule a follow up visit in 3 months but call sooner if needed

## 2022-01-10 NOTE — Assessment & Plan Note (Addendum)
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  08/03/21  - 09/16/2021   Walked on RA 3  x  3  lap(s) =  approx 450  ft  @ fast pace, stopped due to end of study  with lowest 02 sats 94% and sob on 3rd lap  - 09/16/2021  After extensive coaching inhaler device,  effectiveness =    90% > continue trelegy 100 q am  - PFT's  11/18/21   FEV1 1.94 (69 % ) ratio 0.51  p 0 % improvement from saba p trelegy prior to study with   FV curve classic concavity    >>>    01/10/2022  After extensive coaching inhaler device,  effectiveness =    80% with cough the main cc so try breztri instead of trelegy  100  and take ppi bid until cough better then just q am ac    Group D (now reclassified as E) in terms of symptom/risk and laba/lama/ICS  therefore appropriate rx at this point >>>  breztri  2bid and work on stopping all smoking

## 2022-01-10 NOTE — Progress Notes (Signed)
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   When moved to [facility] 2020 weighed 140 s sob   History of Present Illness  09/16/2021  Pulmonary/ 1st office eval/ Paul Randall / [place] [facility]  Chief Complaint  Patient presents with   Consult    Patient had CT done and nodules noted. Brought CT CD   Dyspnea:  onset with wt gain - walks across parking lot/ no problem slow pace, main problem is when bend over  - ex tol better p trelegy  Cough: some with ex / variably discolored never bloody  Sleep: insomnia/ flat bed / 2 pillows  SABA use: spiriva respimat > not helpful  Rec The key is to stop smoking completely before smoking completely stops you! For nasty mucus > zpak  (refillable) For thick mucus/ congested cough >  add mucinex dm (over the counter) 1200 mg every 12 hours as needed  We will walk you here for a baseline No change trelegy once daily  We will see you back here after you have PFTs at [facility]    01/10/2022  f/u ov/Paul Randall office/Paul Randall re: GOLD 2 maint on trelegy and prn zpak cough is the main concern  Chief Complaint  Patient presents with   Follow-up    Pft done in May Breathing is about the same.   Dyspnea:  struggle to do weed eating more 20-30 min  Cough: clear  esp p supper and hs  Sleeping: level bed/ 2pillows  SABA use: none  02: none  Covid status: vaxx 2  Lung cancer screening: [place] per hasanj  No obvious day to day or daytime variability or assoc excess/ purulent sputum or mucus plugs or hemoptysis or cp or chest tightness, subjective wheeze or overt sinus or hb symptoms.   Sleeping  without nocturnal  or early am exacerbation  of respiratory  c/o's or need for noct saba. Also denies any obvious fluctuation of symptoms with weather or environmental changes or other  aggravating or alleviating factors except as outlined above   No unusual exposure hx or h/o childhood pna/ asthma or knowledge of premature birth.  Current Allergies, Complete Past Medical History, Past Surgical History, Family History, and Social History were reviewed in [employer] record.  ROS  The following are not active complaints unless bolded Hoarseness, sore throat, dysphagia, dental problems, itching, sneezing,  nasal congestion or discharge of excess mucus or purulent secretions, ear ache,   fever, chills, sweats, unintended wt loss or wt gain, classically pleuritic or exertional cp,  orthopnea pnd or arm/hand swelling  or leg swelling, presyncope, palpitations, abdominal pain, anorexia, nausea, vomiting, diarrhea  or change in bowel habits or change in bladder habits, change in stools or change in urine, dysuria, hematuria,  rash, arthralgias, visual complaints, headache, numbness, weakness or ataxia or problems with walking or coordination,  change in mood or  memory.        Current Meds  Medication Sig   aspirin 81 MG EC tablet Take by mouth.   atorvastatin (LIPITOR) 40 MG tablet Take 40 mg by mouth daily.   azithromycin (ZITHROMAX) 250 MG tablet Take 2 on day one then 1 daily x 4 days   isosorbide mononitrate (IMDUR) 30 MG 24 hr tablet Take  30 mg by mouth daily.   loratadine (CLARITIN) 10 MG tablet Take 10 mg by mouth daily.   metoprolol succinate (TOPROL-XL) 25 MG 24 hr tablet Take 25 mg by mouth daily.   naproxen sodium (ALEVE) 220 MG tablet Take 220 mg by mouth.   Omega-3 Fatty Acids (FISH OIL) 1000 MG CAPS Take by mouth.   omeprazole (PRILOSEC) 20 MG capsule Take 1 capsule (20 mg total) by mouth daily.   TRELEGY ELLIPTA 200-62.5-25 MCG/ACT AEPB Inhale 1 puff into the lungs daily.             Objective:      Wt Readings from Last 3 Encounters:  01/10/22 201 lb 9.6 oz (91.4 kg)  10/26/21 196 lb (88.9 kg)  09/16/21 197 lb 6.4 oz (89.5 kg)       Vital signs reviewed  01/10/2022  - Note at rest 02 sats  97% on RA   General appearance:    amb obese wm edentulous     HEENT : Oropharynx clear   Nasal turbinates nl    NECK :  without  apparent JVD/ palpable Nodes/TM    LUNGS: no acc muscle use,  Mild barrel  contour chest wall with bilateral insp/ exp rhonchi and  without cough on insp or exp maneuvers  and mild  Hyperresonant  to  percussion bilaterally     CV:  RRR  no s3 or murmur or increase in P2, and no edema   ABD: obese  soft and nontender with pos end  insp Hoover's  in the supine position.  No bruits or organomegaly appreciated   MS:  Nl gait/ ext warm without deformities Or obvious joint restrictions  calf tenderness, cyanosis or clubbing     SKIN: warm and dry without lesions    NEURO:  alert, approp, nl sensorium with  no motor or cerebellar deficits apparent.       I personally reviewed images and agree with radiology impression as follows:   Chest LDSCT  07/31/21 Biapical pleuroparenchymal scarring. Centrilobular and  paraseptal emphysema. Smoking related respiratory bronchiolitis. Mid  and lower lung zone predominant peribronchovascular nodularity with interval progression from 02/01/2021. Some previously measured nodules are no longer present. Nodular densities measure 9.8 mm or less in size. No pleural fluid. Airway is unremarkable.      Assessment

## 2022-01-10 NOTE — Assessment & Plan Note (Signed)
Counseled re importance of smoking cessation but did not meet time criteria for separate billing     F/u CT's planned by PCP in [place]           Each maintenance medication was reviewed in detail including emphasizing most importantly the difference between maintenance and prns and under what circumstances the prns are to be triggered using an action plan format where appropriate.  Total time for H and P, chart review, counseling, reviewing hfa device(s) and generating customized AVS unique to this office visit / same day charting > 30 min for multiple  refractory respiratory  symptoms of uncertain etiology

## 2022-03-17 DIAGNOSIS — I10 Essential (primary) hypertension: Secondary | ICD-10-CM | POA: Diagnosis not present

## 2022-03-17 DIAGNOSIS — J449 Chronic obstructive pulmonary disease, unspecified: Secondary | ICD-10-CM | POA: Diagnosis not present

## 2022-03-17 DIAGNOSIS — E782 Mixed hyperlipidemia: Secondary | ICD-10-CM | POA: Diagnosis not present

## 2022-03-17 DIAGNOSIS — K219 Gastro-esophageal reflux disease without esophagitis: Secondary | ICD-10-CM | POA: Diagnosis not present

## 2022-03-17 DIAGNOSIS — I700 Atherosclerosis of aorta: Secondary | ICD-10-CM | POA: Diagnosis not present

## 2022-04-18 ENCOUNTER — Ambulatory Visit (INDEPENDENT_AMBULATORY_CARE_PROVIDER_SITE_OTHER): Payer: Medicare Other | Admitting: Internal Medicine

## 2022-04-18 ENCOUNTER — Encounter: Payer: Self-pay | Admitting: Internal Medicine

## 2022-04-18 DIAGNOSIS — F17210 Nicotine dependence, cigarettes, uncomplicated: Secondary | ICD-10-CM | POA: Diagnosis not present

## 2022-04-18 DIAGNOSIS — J449 Chronic obstructive pulmonary disease, unspecified: Secondary | ICD-10-CM | POA: Diagnosis not present

## 2022-04-18 NOTE — Patient Instructions (Signed)
Work on inhaler technique:  relax and gently blow all the way out then take a nice smooth full deep breath back in, triggering the inhaler at same time you start breathing in.  Hold breath in for at least  5 seconds if you can. Blow out breztri  thru nose. Rinse and gargle with water when done.  If mouth or throat bother you at all,  try brushing teeth/gums/tongue with arm and hammer toothpaste/ make a slurry and gargle and spit out.    Please remember to go to the lab department   for your tests - we will call you with the results when they are available.   The key is to stop smoking completely before smoking completely stops you!   Please schedule a follow up visit in 12 months but call sooner if needed

## 2022-04-18 NOTE — Assessment & Plan Note (Signed)
Counseled re importance of smoking cessation but did not meet time criteria for separate billing            Each maintenance medication was reviewed in detail including emphasizing most importantly the difference between maintenance and prns and under what circumstances the prns are to be triggered using an action plan format where appropriate.  Total time for H and P, chart review, counseling, reviewing hfa device(s) and generating customized AVS unique to this office visit / same day charting = 24 min       . 

## 2022-04-18 NOTE — Progress Notes (Signed)
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   When moved to [facility] 2020 weighed 140 s sob   History of Present Illness  09/16/2021  Pulmonary/ 1st office eval/ Paul Randall / [place] [facility]  Chief Complaint  Patient presents with   Consult    Patient had CT done and nodules noted. Brought CT CD   Dyspnea:  onset with wt gain - walks across parking lot/ no problem slow pace, main problem is when bend over  - ex tol better p trelegy  Cough: some with ex / variably discolored never bloody  Sleep: insomnia/ flat bed / 2 pillows  SABA use: spiriva respimat > not helpful  Rec The key is to stop smoking completely before smoking completely stops you! For nasty mucus > zpak  (refillable) For thick mucus/ congested cough >  add mucinex dm (over the counter) 1200 mg every 12 hours as needed  We will walk you here for a baseline No change trelegy once daily  We will see you back here after you have PFTs at [facility]    01/10/2022  f/u ov/[place] office/Paul Randall re: GOLD 2 maint on trelegy and prn zpak cough is the main concern  Chief Complaint  Patient presents with   Follow-up    Pft done in May Breathing is about the same.   Dyspnea:  struggle to do weed eating more 20-30 min  Cough: clear  esp p supper and hs  Sleeping: level bed/ 2pillows  SABA use: none  02: none  Covid status: vaxx 2  Lung cancer screening: [place] per [facility]anj Rec Stop the trelegy  Start Breztri Take 2 puffs first thing in am and then another 2 puffs about 12 hours later.   Work on inhaler technique: Change omeprazole to where you take it Take 30- 60 min before your first and last meals of the day until cough is gone then back to Take 30-60 min before first meal of the day The key is to stop smoking completely before smoking  completely stops you!    04/18/2022  f/u ov/[place] office/Paul Randall re: copd 2 / active smoker  maint on breztri   Chief Complaint  Patient presents with   Follow-up    Patient feels he has improved since last ov    Dyspnea:  installed metal roof last week / ladders ok  Cough: variable sometimes with ex and has but not as bad  Sleeping: level bed 2 pillows  SABA use: once or twice a month 02: none  Lung cancer screening: DR Paul Randall already doing q feb   No obvious day to day or daytime variability or assoc excess/ purulent sputum or mucus plugs or hemoptysis or cp or chest tightness, subjective wheeze or overt sinus or hb symptoms.   Sleeping as above  without nocturnal  or early am exacerbation  of respiratory  c/o's or need for noct saba. Also denies any obvious fluctuation of symptoms with weather or environmental changes or other aggravating or alleviating factors except as outlined above   No unusual exposure hx or h/o childhood pna/ asthma or knowledge of premature birth.  Current Allergies, Complete Past Medical History, Past Surgical History, Family History, and Social History were reviewed in [employer] record.  ROS  The following are not active complaints unless bolded Hoarseness, sore throat, dysphagia, dental problems, itching, sneezing,  nasal congestion or discharge of excess mucus or purulent secretions, ear ache,   fever, chills, sweats, unintended wt loss or wt gain, classically pleuritic or exertional cp,  orthopnea pnd or arm/hand swelling  or leg swelling, presyncope, palpitations, abdominal pain, anorexia, nausea, vomiting, diarrhea  or change in bowel habits or change in bladder habits, change in stools or change in urine, dysuria, hematuria,  rash, arthralgias, visual complaints, headache, numbness, weakness or ataxia or problems with walking or coordination,  change in mood or  memory.        Current Meds  Medication Sig   aspirin 81 MG EC  tablet Take by mouth.   atorvastatin (LIPITOR) 40 MG tablet Take 40 mg by mouth daily.   Budeson-Glycopyrrol-Formoterol (BREZTRI AEROSPHERE) 160-9-4.8 MCG/ACT AERO Take 2 puffs first thing in am and then another 2 puffs about 12 hours later.   isosorbide mononitrate (IMDUR) 30 MG 24 hr tablet Take 30 mg by mouth daily.   loratadine (CLARITIN) 10 MG tablet Take 10 mg by mouth daily.   metoprolol succinate (TOPROL-XL) 25 MG 24 hr tablet Take 25 mg by mouth daily.   naproxen sodium (ALEVE) 220 MG tablet Take 220 mg by mouth.   Omega-3 Fatty Acids (FISH OIL) 1000 MG CAPS Take by mouth.   omeprazole (PRILOSEC) 20 MG capsule Take 1 capsule (20 mg total) by mouth daily.            Objective:       04/18/2022    204   01/10/22 201 lb 9.6 oz (91.4 kg)  10/26/21 196 lb (88.9 kg)  09/16/21 197 lb 6.4 oz (89.5 kg)    Vital signs reviewed  04/18/2022  - Note at rest 02 sats  97% on RA   General appearance:    amb obese wm nad / min gruff voice      HEENT : Oropharynx  clear/ edentulous  Nasal turbinates nl    NECK :  without  apparent JVD/ palpable Nodes/TM    LUNGS: no acc muscle use,  Min barrel  contour chest wall with bilateral  slightly decreased bs s audible wheeze and  without cough on insp or exp maneuvers and min  Hyperresonant  to  percussion bilaterally    CV:  RRR  no s3 or murmur or increase in P2, and no edema   ABD:  soft and nontender with pos end  insp Hoover's  in the supine position.  No bruits or organomegaly appreciated   MS:  Nl gait/ ext warm without deformities Or obvious joint restrictions  calf tenderness, cyanosis or clubbing     SKIN: warm and dry without lesions    NEURO:  alert, approp, nl sensorium with  no motor or cerebellar deficits apparent.                  Assessment

## 2022-04-18 NOTE — Assessment & Plan Note (Signed)
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  08/03/21  - 09/16/2021   Walked on RA 3  x  3  lap(s) =  approx 450  ft  @ fast pace, stopped due to end of study  with lowest 02 sats 94% and sob on 3rd lap  - 09/16/2021  After extensive coaching inhaler device,  effectiveness =    90% > continue trelegy 100 q am  - PFT's  11/18/21   FEV1 1.94 (69 % ) ratio 0.51  p 0 % improvement from saba p trelegy prior to study with   FV curve classic concavity   - 01/10/2022   try breztri instead of trelegy and take ppi bid until cough better then just q am a - 04/18/2022  After extensive coaching inhaler device,  effectiveness =   80% (short Ti)    Group D (now reclassified as E) in terms of symptom/risk and laba/lama/ICS  therefore appropriate rx at this point >>>  Breztri 2bid and work hard on smoking cessation

## 2022-04-25 LAB — ALPHA-1-ANTITRYPSIN PHENOTYP: A-1 Antitrypsin: 160 mg/dL (ref 101–187)

## 2022-05-03 DIAGNOSIS — H2513 Age-related nuclear cataract, bilateral: Secondary | ICD-10-CM | POA: Diagnosis not present

## 2022-05-13 ENCOUNTER — Other Ambulatory Visit: Payer: Self-pay | Admitting: Internal Medicine

## 2022-05-13 NOTE — Telephone Encounter (Signed)
Order was sent on 01/10/22 with 11 additional refills attached. Pt should have refills available.

## 2022-05-26 DIAGNOSIS — H01004 Unspecified blepharitis left upper eyelid: Secondary | ICD-10-CM | POA: Diagnosis not present

## 2022-05-26 DIAGNOSIS — H11153 Pinguecula, bilateral: Secondary | ICD-10-CM | POA: Diagnosis not present

## 2022-05-26 DIAGNOSIS — I700 Atherosclerosis of aorta: Secondary | ICD-10-CM | POA: Diagnosis not present

## 2022-05-26 DIAGNOSIS — H02831 Dermatochalasis of right upper eyelid: Secondary | ICD-10-CM | POA: Diagnosis not present

## 2022-05-26 DIAGNOSIS — H01001 Unspecified blepharitis right upper eyelid: Secondary | ICD-10-CM | POA: Diagnosis not present

## 2022-05-26 DIAGNOSIS — E782 Mixed hyperlipidemia: Secondary | ICD-10-CM | POA: Diagnosis not present

## 2022-05-26 DIAGNOSIS — H02834 Dermatochalasis of left upper eyelid: Secondary | ICD-10-CM | POA: Diagnosis not present

## 2022-05-26 DIAGNOSIS — H2513 Age-related nuclear cataract, bilateral: Secondary | ICD-10-CM | POA: Diagnosis not present

## 2022-05-26 DIAGNOSIS — I10 Essential (primary) hypertension: Secondary | ICD-10-CM | POA: Diagnosis not present

## 2022-05-26 DIAGNOSIS — K219 Gastro-esophageal reflux disease without esophagitis: Secondary | ICD-10-CM | POA: Diagnosis not present

## 2022-05-26 DIAGNOSIS — H01005 Unspecified blepharitis left lower eyelid: Secondary | ICD-10-CM | POA: Diagnosis not present

## 2022-05-26 DIAGNOSIS — H01002 Unspecified blepharitis right lower eyelid: Secondary | ICD-10-CM | POA: Diagnosis not present

## 2022-05-26 DIAGNOSIS — J449 Chronic obstructive pulmonary disease, unspecified: Secondary | ICD-10-CM | POA: Diagnosis not present

## 2022-05-30 DIAGNOSIS — H2511 Age-related nuclear cataract, right eye: Secondary | ICD-10-CM | POA: Diagnosis not present

## 2022-06-01 NOTE — H&P (Signed)
Surgical History & Physical  Patient Name: Paul Randall DOB: Jan 10, 1946  Surgery: Cataract extraction with intraocular lens implant phacoemulsification; Right Eye  Surgeon: Fabio Pierce MD Surgery Date:  06-06-22 Pre-Op Date:  05-26-22  HPI: A 75 Yr. old male patient 1. 1. The patient is returning for a cataract follow-up of both eyes. Since the last visit, the affected area is worsening. The patient's vision is blurry. The complaint is associated with difficulty recognizing people at a distance, difficulty driving at night due to halos/glare, and difficulty reading small print on medicine bottles/labels/books. This is negatively affecting the patient's quality of life and the patient is unable to function adequately in life with the current level of vision. HPI was performed by Fabio Pierce .  Medical History: Hx of trauma OU metal in the eye High Blood Pressure  Review of Systems Negative Allergic/Immunologic Negative Cardiovascular Negative Constitutional Negative Ear, Nose, Mouth & Throat Negative Endocrine Negative Eyes Negative Gastrointestinal Negative Genitourinary Negative Hemotologic/Lymphatic Negative Integumentary Negative Musculoskeletal Negative Neurological Negative Psychiatry Negative Respiratory  Social   Current every day smoker   Medication Artificial Tears,  Unknown BP medicine,   Sx/Procedures Neck surgery, Foot Surgery,   Drug Allergies   NKDA  History & Physical: Heent: Cataract, right eye NECK: supple without bruits LUNGS: lungs clear to auscultation CV: regular rate and rhythm Abdomen: soft and non-tender Impression & Plan: Assessment: 1.  NUCLEAR SCLEROSIS AGE RELATED; Both Eyes (H25.13) 2.  BLEPHARITIS; Right Upper Lid, Right Lower Lid, Left Upper Lid, Left Lower Lid (H01.001, H01.002,H01.004,H01.005) 3.  DERMATOCHALASIS, no surgery; Right Upper Lid, Left Upper Lid (H02.831, H02.834) 4.  Pinguecula; Both Eyes (H11.153)  Plan: 1.   Cataract accounts for the patient's decreased vision. This visual impairment is not correctable with a tolerable change in glasses or contact lenses. Cataract surgery with an implantation of a new lens should significantly improve the visual and functional status of the patient. Discussed all risks, benefits, alternatives, and potential complications. Discussed the procedures and recovery. Patient desires to have surgery. A-scan ordered and performed today for intra-ocular lens calculations. The surgery will be performed in order to improve vision for driving, reading, and for eye examinations. Recommend phacoemulsification with intra-ocular lens. Recommend Dextenza for post-operative pain and inflammation. Right Eye worse - first. Dilates poorly - shugarcaine by protocol. Malyugin Ring. Omidira.  2.  Recommend regular lid cleaning.  3.  Asymptomatic, recommend observation for now. Findings, prognosis and treatment options reviewed.  4.  Observe; Artificial tears as needed for irritation.

## 2022-06-02 ENCOUNTER — Other Ambulatory Visit: Payer: Self-pay

## 2022-06-02 ENCOUNTER — Encounter (HOSPITAL_COMMUNITY)
Admission: RE | Admit: 2022-06-02 | Discharge: 2022-06-02 | Disposition: A | Discharge status: Home / Self Care | Payer: Medicare Other | Source: Ambulatory Visit | Attending: Ophthalmology | Admitting: Ophthalmology

## 2022-06-02 ENCOUNTER — Encounter (HOSPITAL_COMMUNITY): Payer: Self-pay

## 2022-06-02 HISTORY — DX: Chronic obstructive pulmonary disease, unspecified: J44.9

## 2022-06-06 ENCOUNTER — Ambulatory Visit (HOSPITAL_BASED_OUTPATIENT_CLINIC_OR_DEPARTMENT_OTHER): Payer: Medicare Other | Admitting: Certified Registered Nurse Anesthetist

## 2022-06-06 ENCOUNTER — Encounter (HOSPITAL_COMMUNITY): Payer: Self-pay | Admitting: Ophthalmology

## 2022-06-06 ENCOUNTER — Ambulatory Visit (HOSPITAL_COMMUNITY)
Admission: RE | Admit: 2022-06-06 | Discharge: 2022-06-06 | Disposition: A | Discharge status: Home / Self Care | Payer: Medicare Other | Attending: Ophthalmology | Admitting: Ophthalmology

## 2022-06-06 ENCOUNTER — Ambulatory Visit (HOSPITAL_COMMUNITY): Payer: Medicare Other | Admitting: Certified Registered Nurse Anesthetist

## 2022-06-06 ENCOUNTER — Encounter (HOSPITAL_COMMUNITY)
Admission: RE | Disposition: A | Discharge status: Home / Self Care | Payer: Self-pay | Source: Home / Self Care | Attending: Ophthalmology

## 2022-06-06 DIAGNOSIS — H11153 Pinguecula, bilateral: Secondary | ICD-10-CM | POA: Insufficient documentation

## 2022-06-06 DIAGNOSIS — F17200 Nicotine dependence, unspecified, uncomplicated: Secondary | ICD-10-CM | POA: Diagnosis not present

## 2022-06-06 DIAGNOSIS — H2511 Age-related nuclear cataract, right eye: Secondary | ICD-10-CM

## 2022-06-06 DIAGNOSIS — H0100B Unspecified blepharitis left eye, upper and lower eyelids: Secondary | ICD-10-CM | POA: Insufficient documentation

## 2022-06-06 DIAGNOSIS — I10 Essential (primary) hypertension: Secondary | ICD-10-CM

## 2022-06-06 DIAGNOSIS — H02834 Dermatochalasis of left upper eyelid: Secondary | ICD-10-CM | POA: Insufficient documentation

## 2022-06-06 DIAGNOSIS — H0100A Unspecified blepharitis right eye, upper and lower eyelids: Secondary | ICD-10-CM | POA: Diagnosis not present

## 2022-06-06 DIAGNOSIS — F17210 Nicotine dependence, cigarettes, uncomplicated: Secondary | ICD-10-CM

## 2022-06-06 DIAGNOSIS — J449 Chronic obstructive pulmonary disease, unspecified: Secondary | ICD-10-CM | POA: Diagnosis not present

## 2022-06-06 DIAGNOSIS — H2513 Age-related nuclear cataract, bilateral: Secondary | ICD-10-CM | POA: Insufficient documentation

## 2022-06-06 DIAGNOSIS — H02831 Dermatochalasis of right upper eyelid: Secondary | ICD-10-CM | POA: Diagnosis not present

## 2022-06-06 HISTORY — PX: CATARACT EXTRACTION W/PHACO: SHX586

## 2022-06-06 SURGERY — PHACOEMULSIFICATION, CATARACT, WITH IOL INSERTION
Anesthesia: Monitor Anesthesia Care | Site: Eye | Laterality: Right

## 2022-06-06 MED ORDER — PHENYLEPHRINE-KETOROLAC 1-0.3 % IO SOLN
INTRAOCULAR | Status: AC
  Filled 2022-06-06: qty 4

## 2022-06-06 MED ORDER — LIDOCAINE HCL 3.5 % OP GEL
1.0000 | Freq: Once | OPHTHALMIC | Status: AC
  Administered 2022-06-06: 1 via OPHTHALMIC

## 2022-06-06 MED ORDER — LIDOCAINE HCL (PF) 1 % IJ SOLN
INTRAOCULAR | Status: DC | PRN
  Administered 2022-06-06: 1 mL via OPHTHALMIC

## 2022-06-06 MED ORDER — EPINEPHRINE PF 1 MG/ML IJ SOLN
INTRAMUSCULAR | Status: AC
  Filled 2022-06-06: qty 2

## 2022-06-06 MED ORDER — MIDAZOLAM HCL 2 MG/2ML IJ SOLN
INTRAMUSCULAR | Status: AC
  Filled 2022-06-06: qty 2

## 2022-06-06 MED ORDER — SODIUM HYALURONATE 23MG/ML IO SOSY
PREFILLED_SYRINGE | INTRAOCULAR | Status: DC | PRN
  Administered 2022-06-06: .6 mL via INTRAOCULAR

## 2022-06-06 MED ORDER — PHENYLEPHRINE HCL 2.5 % OP SOLN
1.0000 [drp] | OPHTHALMIC | Status: AC | PRN
  Administered 2022-06-06 (×3): 1 [drp] via OPHTHALMIC

## 2022-06-06 MED ORDER — SODIUM HYALURONATE 10 MG/ML IO SOLUTION
PREFILLED_SYRINGE | INTRAOCULAR | Status: DC | PRN
  Administered 2022-06-06: .85 mL via INTRAOCULAR

## 2022-06-06 MED ORDER — PHENYLEPHRINE-KETOROLAC 1-0.3 % IO SOLN
INTRAOCULAR | Status: DC | PRN
  Administered 2022-06-06: 500 mL via OPHTHALMIC

## 2022-06-06 MED ORDER — TETRACAINE HCL 0.5 % OP SOLN
1.0000 [drp] | OPHTHALMIC | Status: AC | PRN
  Administered 2022-06-06 (×3): 1 [drp] via OPHTHALMIC

## 2022-06-06 MED ORDER — STERILE WATER FOR IRRIGATION IR SOLN
Status: DC | PRN
  Administered 2022-06-06: 250 mL

## 2022-06-06 MED ORDER — TROPICAMIDE 1 % OP SOLN
1.0000 [drp] | OPHTHALMIC | Status: AC | PRN
  Administered 2022-06-06 (×3): 1 [drp] via OPHTHALMIC

## 2022-06-06 MED ORDER — POVIDONE-IODINE 5 % OP SOLN
OPHTHALMIC | Status: DC | PRN
  Administered 2022-06-06: 1 via OPHTHALMIC

## 2022-06-06 MED ORDER — BSS IO SOLN
INTRAOCULAR | Status: DC | PRN
  Administered 2022-06-06: 15 mL via INTRAOCULAR

## 2022-06-06 MED ORDER — MOXIFLOXACIN HCL 0.5 % OP SOLN
OPHTHALMIC | Status: DC | PRN
  Administered 2022-06-06: .2 mL via OPHTHALMIC

## 2022-06-06 MED ORDER — MOXIFLOXACIN HCL 5 MG/ML IO SOLN
INTRAOCULAR | Status: AC
  Filled 2022-06-06: qty 1

## 2022-06-06 SURGICAL SUPPLY — 16 items
CATARACT SUITE SIGHTPATH (MISCELLANEOUS) ×1 IMPLANT
CLOTH BEACON ORANGE TIMEOUT ST (SAFETY) ×1 IMPLANT
EYE SHIELD UNIVERSAL CLEAR (GAUZE/BANDAGES/DRESSINGS) IMPLANT
FEE CATARACT SUITE SIGHTPATH (MISCELLANEOUS) ×1 IMPLANT
GLOVE BIOGEL PI IND STRL 6.5 (GLOVE) IMPLANT
GLOVE BIOGEL PI IND STRL 7.0 (GLOVE) ×2 IMPLANT
GLOVE SURG SS PI 7.0 STRL IVOR (GLOVE) IMPLANT
LENS IOL RAYNER 25.0 (Intraocular Lens) ×1 IMPLANT
LENS IOL RAYONE EMV 25.0 (Intraocular Lens) IMPLANT
NDL HYPO 18GX1.5 BLUNT FILL (NEEDLE) ×1 IMPLANT
NEEDLE HYPO 18GX1.5 BLUNT FILL (NEEDLE) ×1 IMPLANT
PAD ARMBOARD 7.5X6 YLW CONV (MISCELLANEOUS) ×1 IMPLANT
SYR TB 1ML LL NO SAFETY (SYRINGE) ×1 IMPLANT
TAPE SURG TRANSPORE 1 IN (GAUZE/BANDAGES/DRESSINGS) IMPLANT
TAPE SURGICAL TRANSPORE 1 IN (GAUZE/BANDAGES/DRESSINGS) ×1
WATER STERILE IRR 250ML POUR (IV SOLUTION) ×1 IMPLANT

## 2022-06-06 NOTE — Interval H&P Note (Signed)
History and Physical Interval Note:  06/06/2022 9:21 AM  [name]  has presented today for surgery, with the diagnosis of nuclear sclerosis age related cataract; right eye.  The various methods of treatment have been discussed with the patient and family. After consideration of risks, benefits and other options for treatment, the patient has consented to  Procedure(s) with comments: CATARACT EXTRACTION PHACO AND INTRAOCULAR LENS PLACEMENT (IOC) (Right) - CDE as a surgical intervention.  The patient's history has been reviewed, patient examined, no change in status, stable for surgery.  I have reviewed the patient's chart and labs.  Questions were answered to the patient's satisfaction.     Paul Randall

## 2022-06-06 NOTE — Anesthesia Preprocedure Evaluation (Addendum)
Anesthesia Evaluation  Patient identified by MRN, date of birth, ID band Patient awake    Reviewed: Allergy & Precautions, H&P , NPO status , Patient's Chart, lab work & pertinent test results, reviewed documented beta blocker date and time   Airway Mallampati: II  TM Distance: >3 FB Neck ROM: full    Dental no notable dental hx.    Pulmonary COPD, Current Smoker and Patient abstained from smoking.   Pulmonary exam normal breath sounds clear to auscultation       Cardiovascular Exercise Tolerance: Good hypertension,  Rhythm:regular Rate:Normal     Neuro/Psych negative neurological ROS  negative psych ROS   GI/Hepatic negative GI ROS, Neg liver ROS,,,  Endo/Other  negative endocrine ROS    Renal/GU negative Renal ROS  negative genitourinary   Musculoskeletal   Abdominal   Peds  Hematology negative hematology ROS (+)   Anesthesia Other Findings   Reproductive/Obstetrics negative OB ROS                              Anesthesia Physical Anesthesia Plan  ASA: 3  Anesthesia Plan: MAC   Post-op Pain Management:    Induction:   PONV Risk Score and Plan: Propofol infusion  Airway Management Planned:   Additional Equipment:   Intra-op Plan:   Post-operative Plan:   Informed Consent: I have reviewed the patients History and Physical, chart, labs and discussed the procedure including the risks, benefits and alternatives for the proposed anesthesia with the patient or authorized representative who has indicated his/her understanding and acceptance.     Dental Advisory Given  Plan Discussed with: CRNA  Anesthesia Plan Comments:         Anesthesia Quick Evaluation

## 2022-06-06 NOTE — Anesthesia Postprocedure Evaluation (Addendum)
Anesthesia Post Note  Patient: AADITH RAUDENBUSH  Procedure(s) Performed: CATARACT EXTRACTION PHACO AND INTRAOCULAR LENS PLACEMENT (IOC) (Right: Eye)  Patient location during evaluation: Phase II Anesthesia Type: MAC Level of consciousness: awake Pain management: pain level controlled Vital Signs Assessment: post-procedure vital signs reviewed and stable Respiratory status: spontaneous breathing and respiratory function stable Cardiovascular status: blood pressure returned to baseline and stable Postop Assessment: no headache and no apparent nausea or vomiting Anesthetic complications: no Comments: Late entry   No notable events documented.   Last Vitals:  Vitals:   06/06/22 0839 06/06/22 0949  BP: 138/73 (!) 151/80  Pulse: 65 64  Resp: 16 15  Temp: 36.7 C 36.7 C  SpO2: 95% 100%    Last Pain:  Vitals:   06/06/22 0949  TempSrc: Oral  PainSc: 0-No pain                 Louann Sjogren

## 2022-06-06 NOTE — Transfer of Care (Signed)
Immediate Anesthesia Transfer of Care Note  Patient: Paul Randall  Procedure(s) Performed: CATARACT EXTRACTION PHACO AND INTRAOCULAR LENS PLACEMENT (IOC) (Right: Eye)  Patient Location: PACU  Anesthesia Type:MAC  Level of Consciousness: awake, alert , and oriented  Airway & Oxygen Therapy: Patient Spontanous Breathing  Post-op Assessment: Report given to RN and Post -op Vital signs reviewed and stable  Post vital signs: Reviewed and stable  Last Vitals:  Vitals Value Taken Time  BP    Temp    Pulse    Resp    SpO2      Last Pain:  Vitals:   06/06/22 0839  TempSrc: Oral  PainSc: 0-No pain         Complications: No notable events documented.

## 2022-06-06 NOTE — Op Note (Signed)
Date of procedure: 06/06/22  Pre-operative diagnosis: Visually significant age-related nuclear cataract, Right Eye (H25.11)  Post-operative diagnosis: Visually significant age-related nuclear cataract, Right Eye  Procedure: Removal of cataract via phacoemulsification and insertion of intra-ocular lens Rayner [id number] +25.0D into the capsular bag of the Right Eye  Attending surgeon: Gerda Diss. Kimbree Casanas, MD, MA  Anesthesia: MAC, Topical Akten  Complications: None  Estimated Blood Loss: <75m (minimal)  Specimens: None  Implants: As above  Indications:  Visually significant age-related cataract, Right Eye  Procedure:  The patient was seen and identified in the pre-operative area. The operative eye was identified and dilated.  The operative eye was marked.  Topical anesthesia was administered to the operative eye.     The patient was then to the operative suite and placed in the supine position.  A timeout was performed confirming the patient, procedure to be performed, and all other relevant information.   The patient's face was prepped and draped in the usual fashion for intra-ocular surgery.  A lid speculum was placed into the operative eye and the surgical microscope moved into place and focused.  A superotemporal paracentesis was created using a 20 gauge paracentesis blade.  Shugarcaine was injected into the anterior chamber.  Viscoelastic was injected into the anterior chamber.  A temporal clear-corneal main wound incision was created using a 2.487mmicrokeratome.  A continuous curvilinear capsulorrhexis was initiated using an irrigating cystitome and completed using capsulorrhexis forceps.  Hydrodissection and hydrodeliniation were performed.  Viscoelastic was injected into the anterior chamber.  A phacoemulsification handpiece and a chopper as a second instrument were used to remove the nucleus and epinucleus. The irrigation/aspiration handpiece was used to remove any remaining cortical  material.   The capsular bag was reinflated with viscoelastic, checked, and found to be intact.  The intraocular lens was inserted into the capsular bag.  The irrigation/aspiration handpiece was used to remove any remaining viscoelastic.  The clear corneal wound and paracentesis wounds were then hydrated and checked with Weck-Cels to be watertight. 0.15m25mf moxifloxacin was injected into the anterior chamber. The lid-speculum and drape was removed, and the patient's face was cleaned with a wet and dry 4x4.  A clear shield was taped over the eye. The patient was taken to the post-operative care unit in good condition, having tolerated the procedure well.  Post-Op Instructions: [facility] post[facility]operative evaluation and will receive all other orders and instructions.

## 2022-06-06 NOTE — Discharge Instructions (Signed)
Please discharge patient when stable, will follow up today with Dr. Estelle Skibicki at the [facility] office immediately following discharge.  Leave shield in place until visit.  All paperwork with discharge instructions will be given at the office.  [facility] Address:  [address]  [place], [place] [address]  

## 2022-06-08 ENCOUNTER — Encounter (HOSPITAL_COMMUNITY): Payer: Self-pay | Admitting: Ophthalmology

## 2022-06-13 DIAGNOSIS — H2512 Age-related nuclear cataract, left eye: Secondary | ICD-10-CM | POA: Diagnosis not present

## 2022-06-14 ENCOUNTER — Encounter (HOSPITAL_COMMUNITY): Payer: Self-pay | Admitting: Ophthalmology

## 2022-06-14 NOTE — H&P (Signed)
Surgical History & Physical  Patient Name: Paul Randall DOB: 23-Dec-1945  Surgery: Cataract extraction with intraocular lens implant phacoemulsification; Left Eye  Surgeon: Fabio Pierce MD Surgery Date:  06-24-22 Pre-Op Date:  06-13-22  HPI: A 5 Yr. old male patient 1. The patient is returning after cataract surgery. The right eye is affected. Status post cataract surgery, which began 1 weeks ago: Since the last visit, the affected area is doing well. Patient mixed up his drops. I went over his drops with him and wrote them out as well. Starting tomorrow he will only use Ilevro qd and Prednisolone BID OD. Patient is having problems driving due to glare during the day and at night. Difficulties reading books and seeing captions or scores on the TV. This is negatively affecting the patient's quality of life and the patient is unable to function adequately in life with the current level of vision. Patient would like to proceed with cataract sx OS. HPI Completed by Dr. Fabio Pierce  Medical History: Hx of trauma OU metal in the eye High Blood Pressure  Review of Systems Negative Allergic/Immunologic Negative Cardiovascular Negative Constitutional Negative Ear, Nose, Mouth & Throat Negative Endocrine Negative Eyes Negative Gastrointestinal Negative Genitourinary Negative Hemotologic/Lymphatic Negative Integumentary Negative Musculoskeletal Negative Neurological Negative Psychiatry Negative Respiratory  Social   Current every day smoker   Medication Artificial Tears, Moxifloxacin, Ilevro, Prednisolone,  Unknown BP medicine,   Sx/Procedures Phaco c IOL OD,  Neck surgery, Foot Surgery,   Drug Allergies   NKDA  History & Physical: Heent: Cataract, left eye NECK: supple without bruits LUNGS: lungs clear to auscultation CV: regular rate and rhythm Abdomen: soft and non-tender Impression & Plan: Assessment: 1.  CATARACT EXTRACTION STATUS; Right Eye (Z98.41) 2.  NUCLEAR  SCLEROSIS AGE RELATED; , Left Eye (H25.12)  Plan: 1.  1 week after cataract surgery. Doing well with improved vision and normal eye pressure. Call with any problems or concerns. Stop Vigamox. Continue Ilevro 1 drop 1x/day for 3 more weeks. Continue Pred Acetate 1 drop 2x/day for 3 more weeks.  2.  Dilates poorly - shugarcaine by protocol. Malyugin Ring. Omidira. Cataract accounts for the patient's decreased vision. This visual impairment is not correctable with a tolerable change in glasses or contact lenses. Cataract surgery with an implantation of a new lens should significantly improve the visual and functional status of the patient. Discussed all risks, benefits, alternatives, and potential complications. Discussed the procedures and recovery. Patient desires to have surgery. A-scan ordered and performed today for intra-ocular lens calculations. The surgery will be performed in order to improve vision for driving, reading, and for eye examinations. Recommend phacoemulsification with intra-ocular lens. Recommend Dextenza for post-operative pain and inflammation. Left Eye. Surgery required to correct imbalance of vision.

## 2022-06-14 NOTE — Addendum Note (Signed)
Addendum  created 06/14/22 0926 by Moshe Salisbury, CRNA   Clinical Note Signed, SmartForm saved

## 2022-06-22 ENCOUNTER — Encounter (HOSPITAL_COMMUNITY): Payer: Self-pay

## 2022-06-22 ENCOUNTER — Encounter (HOSPITAL_COMMUNITY)
Admission: RE | Admit: 2022-06-22 | Discharge: 2022-06-22 | Disposition: A | Discharge status: Home / Self Care | Payer: Medicare Other | Source: Ambulatory Visit | Attending: Ophthalmology | Admitting: Ophthalmology

## 2022-06-23 DIAGNOSIS — I700 Atherosclerosis of aorta: Secondary | ICD-10-CM | POA: Diagnosis not present

## 2022-06-23 DIAGNOSIS — I10 Essential (primary) hypertension: Secondary | ICD-10-CM | POA: Diagnosis not present

## 2022-06-23 DIAGNOSIS — J449 Chronic obstructive pulmonary disease, unspecified: Secondary | ICD-10-CM | POA: Diagnosis not present

## 2022-06-23 DIAGNOSIS — E782 Mixed hyperlipidemia: Secondary | ICD-10-CM | POA: Diagnosis not present

## 2022-06-23 DIAGNOSIS — K219 Gastro-esophageal reflux disease without esophagitis: Secondary | ICD-10-CM | POA: Diagnosis not present

## 2022-06-24 ENCOUNTER — Ambulatory Visit (HOSPITAL_COMMUNITY)
Admission: RE | Admit: 2022-06-24 | Discharge: 2022-06-24 | Disposition: A | Discharge status: Home / Self Care | Payer: Medicare Other | Attending: Ophthalmology | Admitting: Ophthalmology

## 2022-06-24 ENCOUNTER — Other Ambulatory Visit: Payer: Self-pay

## 2022-06-24 ENCOUNTER — Encounter (HOSPITAL_COMMUNITY)
Admission: RE | Disposition: A | Discharge status: Home / Self Care | Payer: Self-pay | Source: Home / Self Care | Attending: Ophthalmology

## 2022-06-24 ENCOUNTER — Ambulatory Visit (HOSPITAL_BASED_OUTPATIENT_CLINIC_OR_DEPARTMENT_OTHER): Payer: Medicare Other | Admitting: Anesthesiology

## 2022-06-24 ENCOUNTER — Ambulatory Visit (HOSPITAL_COMMUNITY): Payer: Medicare Other | Admitting: Anesthesiology

## 2022-06-24 ENCOUNTER — Encounter (HOSPITAL_COMMUNITY): Payer: Self-pay | Admitting: Ophthalmology

## 2022-06-24 DIAGNOSIS — F17200 Nicotine dependence, unspecified, uncomplicated: Secondary | ICD-10-CM | POA: Insufficient documentation

## 2022-06-24 DIAGNOSIS — I10 Essential (primary) hypertension: Secondary | ICD-10-CM | POA: Diagnosis not present

## 2022-06-24 DIAGNOSIS — I252 Old myocardial infarction: Secondary | ICD-10-CM

## 2022-06-24 DIAGNOSIS — H2512 Age-related nuclear cataract, left eye: Secondary | ICD-10-CM | POA: Insufficient documentation

## 2022-06-24 DIAGNOSIS — Z79899 Other long term (current) drug therapy: Secondary | ICD-10-CM | POA: Insufficient documentation

## 2022-06-24 DIAGNOSIS — F17210 Nicotine dependence, cigarettes, uncomplicated: Secondary | ICD-10-CM | POA: Diagnosis not present

## 2022-06-24 DIAGNOSIS — J449 Chronic obstructive pulmonary disease, unspecified: Secondary | ICD-10-CM

## 2022-06-24 HISTORY — PX: CATARACT EXTRACTION W/PHACO: SHX586

## 2022-06-24 SURGERY — PHACOEMULSIFICATION, CATARACT, WITH IOL INSERTION
Anesthesia: Monitor Anesthesia Care | Site: Eye | Laterality: Left

## 2022-06-24 MED ORDER — STERILE WATER FOR IRRIGATION IR SOLN
Status: DC | PRN
  Administered 2022-06-24: 250 mL

## 2022-06-24 MED ORDER — LIDOCAINE HCL (PF) 1 % IJ SOLN
INTRAOCULAR | Status: DC | PRN
  Administered 2022-06-24: 1 mL via OPHTHALMIC

## 2022-06-24 MED ORDER — MOXIFLOXACIN HCL 0.5 % OP SOLN
OPHTHALMIC | Status: DC | PRN
  Administered 2022-06-24: .2 mL via OPHTHALMIC

## 2022-06-24 MED ORDER — LIDOCAINE HCL 3.5 % OP GEL
1.0000 | Freq: Once | OPHTHALMIC | Status: AC
  Administered 2022-06-24: 1 via OPHTHALMIC

## 2022-06-24 MED ORDER — EPINEPHRINE PF 1 MG/ML IJ SOLN
INTRAMUSCULAR | Status: AC
  Filled 2022-06-24: qty 2

## 2022-06-24 MED ORDER — PHENYLEPHRINE-KETOROLAC 1-0.3 % IO SOLN
INTRAOCULAR | Status: AC
  Filled 2022-06-24: qty 4

## 2022-06-24 MED ORDER — SODIUM HYALURONATE 10 MG/ML IO SOLUTION
PREFILLED_SYRINGE | INTRAOCULAR | Status: DC | PRN
  Administered 2022-06-24: .85 mL via INTRAOCULAR

## 2022-06-24 MED ORDER — MIDAZOLAM HCL 2 MG/2ML IJ SOLN
INTRAMUSCULAR | Status: AC
  Filled 2022-06-24: qty 2

## 2022-06-24 MED ORDER — PHENYLEPHRINE-KETOROLAC 1-0.3 % IO SOLN
INTRAOCULAR | Status: DC | PRN
  Administered 2022-06-24: 500 mL via OPHTHALMIC

## 2022-06-24 MED ORDER — MOXIFLOXACIN HCL 5 MG/ML IO SOLN
INTRAOCULAR | Status: AC
  Filled 2022-06-24: qty 1

## 2022-06-24 MED ORDER — TETRACAINE HCL 0.5 % OP SOLN
1.0000 [drp] | OPHTHALMIC | Status: AC | PRN
  Administered 2022-06-24 (×3): 1 [drp] via OPHTHALMIC

## 2022-06-24 MED ORDER — SODIUM HYALURONATE 23MG/ML IO SOSY
PREFILLED_SYRINGE | INTRAOCULAR | Status: DC | PRN
  Administered 2022-06-24: .6 mL via INTRAOCULAR

## 2022-06-24 MED ORDER — BSS IO SOLN
INTRAOCULAR | Status: DC | PRN
  Administered 2022-06-24: 15 mL via INTRAOCULAR

## 2022-06-24 MED ORDER — MIDAZOLAM HCL 2 MG/2ML IJ SOLN
INTRAMUSCULAR | Status: DC | PRN
  Administered 2022-06-24: 1 mg via INTRAVENOUS

## 2022-06-24 MED ORDER — TROPICAMIDE 1 % OP SOLN
1.0000 [drp] | OPHTHALMIC | Status: AC | PRN
  Administered 2022-06-24 (×3): 1 [drp] via OPHTHALMIC

## 2022-06-24 MED ORDER — PHENYLEPHRINE HCL 2.5 % OP SOLN
1.0000 [drp] | OPHTHALMIC | Status: AC | PRN
  Administered 2022-06-24 (×3): 1 [drp] via OPHTHALMIC

## 2022-06-24 MED ORDER — POVIDONE-IODINE 5 % OP SOLN
OPHTHALMIC | Status: DC | PRN
  Administered 2022-06-24: 1 via OPHTHALMIC

## 2022-06-24 SURGICAL SUPPLY — 14 items
CATARACT SUITE SIGHTPATH (MISCELLANEOUS) ×1 IMPLANT
CLOTH BEACON ORANGE TIMEOUT ST (SAFETY) ×1 IMPLANT
EYE SHIELD UNIVERSAL CLEAR (GAUZE/BANDAGES/DRESSINGS) IMPLANT
FEE CATARACT SUITE SIGHTPATH (MISCELLANEOUS) ×1 IMPLANT
GLOVE BIOGEL PI IND STRL 7.0 (GLOVE) ×2 IMPLANT
LENS IOL RAYNER 24.5 (Intraocular Lens) ×1 IMPLANT
LENS IOL RAYONE EMV 24.5 (Intraocular Lens) IMPLANT
NDL HYPO 18GX1.5 BLUNT FILL (NEEDLE) ×1 IMPLANT
NEEDLE HYPO 18GX1.5 BLUNT FILL (NEEDLE) ×1 IMPLANT
PAD ARMBOARD 7.5X6 YLW CONV (MISCELLANEOUS) ×1 IMPLANT
SYR TB 1ML LL NO SAFETY (SYRINGE) ×1 IMPLANT
TAPE SURG TRANSPORE 1 IN (GAUZE/BANDAGES/DRESSINGS) IMPLANT
TAPE SURGICAL TRANSPORE 1 IN (GAUZE/BANDAGES/DRESSINGS) ×1
WATER STERILE IRR 250ML POUR (IV SOLUTION) ×1 IMPLANT

## 2022-06-24 NOTE — Transfer of Care (Signed)
Immediate Anesthesia Transfer of Care Note  Patient: Paul Randall  Procedure(s) Performed: CATARACT EXTRACTION PHACO AND INTRAOCULAR LENS PLACEMENT (IOC) (Left: Eye)  Patient Location: Short Stay  Anesthesia Type:MAC  Level of Consciousness: awake, alert , and oriented  Airway & Oxygen Therapy: Patient Spontanous Breathing  Post-op Assessment: Report given to RN and Post -op Vital signs reviewed and stable  Post vital signs: Reviewed and stable  Last Vitals:  Vitals Value Taken Time  BP 154/79 06/24/22 1114  Temp 36.7 C 06/24/22 1114  Pulse 69 06/24/22 1114  Resp 18 06/24/22 1114  SpO2 100 % 06/24/22 1114    Last Pain:  Vitals:   06/24/22 1114  TempSrc: Oral  PainSc: 0-No pain         Complications: No notable events documented.

## 2022-06-24 NOTE — Op Note (Signed)
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 Ethelmae Ringel, MD, MA  Anesthesia: MAC, Topical Akten  Complications: None  Estimated Blood Loss: <83m (minimal)  Specimens: None  Implants: As above  Indications:  Visually significant age-related cataract, Left Eye  Procedure:  The patient was seen and identified in the pre-operative area. The operative eye was identified and dilated.  The operative eye was marked.  Topical anesthesia was administered to the operative eye.     The patient was then to the operative suite and placed in the supine position.  A timeout was performed confirming the patient, procedure to be performed, and all other relevant information.   The patient's face was prepped and draped in the usual fashion for intra-ocular surgery.  A lid speculum was placed into the operative eye and the surgical microscope moved into place and focused.  An inferotemporal paracentesis was created using a 20 gauge paracentesis blade.  Shugarcaine was injected into the anterior chamber.  Viscoelastic was injected into the anterior chamber.  A temporal clear-corneal main wound incision was created using a 2.445mmicrokeratome.  A continuous curvilinear capsulorrhexis was initiated using an irrigating cystitome and completed using capsulorrhexis forceps.  Hydrodissection and hydrodeliniation were performed.  Viscoelastic was injected into the anterior chamber.  A phacoemulsification handpiece and a chopper as a second instrument were used to remove the nucleus and epinucleus. The irrigation/aspiration handpiece was used to remove any remaining cortical material.    The capsular bag was reinflated with viscoelastic, checked, and found to be intact.  The intraocular lens was inserted into the capsular bag.  The irrigation/aspiration handpiece was used to remove any remaining viscoelastic.  The clear corneal wound and paracentesis wounds were then hydrated and checked with Weck-Cels to be watertight. 0.43m22mf moxifloxacin was injected into the anterior chamber. The lid-speculum and drape was removed, and the patient's face was cleaned with a wet and dry 4x4. A clear shield was taped over the eye. The patient was taken to the post-operative care unit in good condition, having tolerated the procedure well.  Post-Op Instructions: The patient will follow up at [facility] a same day post-operative evaluation and will receive all other orders and instructions.

## 2022-06-24 NOTE — Interval H&P Note (Signed)
History and Physical Interval Note:  06/24/2022 10:51 AM  Paul Randall  has presented today for surgery, with the diagnosis of nuclear sclerosis age related cataract; left.  The various methods of treatment have been discussed with the patient and family. After consideration of risks, benefits and other options for treatment, the patient has consented to  Procedure(s) with comments: CATARACT EXTRACTION PHACO AND INTRAOCULAR LENS PLACEMENT (IOC) (Left) - CDE as a surgical intervention.  The patient's history has been reviewed, patient examined, no change in status, stable for surgery.  I have reviewed the patient's chart and labs.  Questions were answered to the patient's satisfaction.     Fabio Pierce

## 2022-06-24 NOTE — Anesthesia Postprocedure Evaluation (Signed)
Anesthesia Post Note  Patient: Paul Randall  Procedure(s) Performed: CATARACT EXTRACTION PHACO AND INTRAOCULAR LENS PLACEMENT (IOC) (Left: Eye)  Patient location during evaluation: Phase II Anesthesia Type: MAC Level of consciousness: awake and alert and oriented Pain management: pain level controlled Vital Signs Assessment: post-procedure vital signs reviewed and stable Respiratory status: spontaneous breathing, nonlabored ventilation and respiratory function stable Cardiovascular status: blood pressure returned to baseline and stable Postop Assessment: no apparent nausea or vomiting Anesthetic complications: no  No notable events documented.   Last Vitals:  Vitals:   06/24/22 1009 06/24/22 1114  BP: (!) 142/76 (!) 154/79  Pulse:  69  Resp:  18  Temp:  36.7 C  SpO2:  100%    Last Pain:  Vitals:   06/24/22 1114  TempSrc: Oral  PainSc: 0-No pain                 Marcus Schwandt C Evert Wenrich

## 2022-06-24 NOTE — Discharge Instructions (Signed)
Please discharge patient when stable, will follow up today with Dr. Leondre Taul at the [facility] [place] office immediately following discharge.  Leave shield in place until visit.  All paperwork with discharge instructions will be given at the office.  [facility] Address:  [address]  [place], [place] [address]  

## 2022-06-24 NOTE — Anesthesia Preprocedure Evaluation (Signed)
Anesthesia Evaluation  Patient identified by MRN, date of birth, ID band Patient awake    Reviewed: Allergy & Precautions, H&P , NPO status , Patient's Chart, lab work & pertinent test results, reviewed documented beta blocker date and time   Airway Mallampati: II  TM Distance: >3 FB Neck ROM: Full   Comment: Neck sx Dental  (+) Upper Dentures, Edentulous Lower   Pulmonary COPD,  COPD inhaler, Current Smoker and Patient abstained from smoking.   Pulmonary exam normal breath sounds clear to auscultation       Cardiovascular hypertension, Pt. on medications and Pt. on home beta blockers + Past MI  Normal cardiovascular exam Rhythm:Regular Rate:Normal     Neuro/Psych negative neurological ROS  negative psych ROS   GI/Hepatic negative GI ROS, Neg liver ROS,,,  Endo/Other  negative endocrine ROS    Renal/GU negative Renal ROS  negative genitourinary   Musculoskeletal negative musculoskeletal ROS (+)    Abdominal   Peds negative pediatric ROS (+)  Hematology negative hematology ROS (+)   Anesthesia Other Findings   Reproductive/Obstetrics negative OB ROS                              Anesthesia Physical Anesthesia Plan  ASA: 3  Anesthesia Plan: MAC   Post-op Pain Management: Minimal or no pain anticipated   Induction: Intravenous  PONV Risk Score and Plan: Treatment may vary due to age or medical condition  Airway Management Planned: Nasal Cannula and Natural Airway  Additional Equipment:   Intra-op Plan:   Post-operative Plan:   Informed Consent: I have reviewed the patients History and Physical, chart, labs and discussed the procedure including the risks, benefits and alternatives for the proposed anesthesia with the patient or authorized representative who has indicated his/her understanding and acceptance.     Dental advisory given  Plan Discussed with: CRNA and  Surgeon  Anesthesia Plan Comments:          Anesthesia Quick Evaluation

## 2022-07-01 ENCOUNTER — Encounter (HOSPITAL_COMMUNITY): Payer: Self-pay | Admitting: Ophthalmology

## 2022-10-04 DIAGNOSIS — K219 Gastro-esophageal reflux disease without esophagitis: Secondary | ICD-10-CM | POA: Diagnosis not present

## 2022-10-04 DIAGNOSIS — I25110 Atherosclerotic heart disease of native coronary artery with unstable angina pectoris: Secondary | ICD-10-CM | POA: Diagnosis not present

## 2022-10-04 DIAGNOSIS — E782 Mixed hyperlipidemia: Secondary | ICD-10-CM | POA: Diagnosis not present

## 2022-10-04 DIAGNOSIS — I700 Atherosclerosis of aorta: Secondary | ICD-10-CM | POA: Diagnosis not present

## 2022-10-04 DIAGNOSIS — J449 Chronic obstructive pulmonary disease, unspecified: Secondary | ICD-10-CM | POA: Diagnosis not present

## 2022-10-04 DIAGNOSIS — Z0000 Encounter for general adult medical examination without abnormal findings: Secondary | ICD-10-CM | POA: Diagnosis not present

## 2022-10-04 DIAGNOSIS — I10 Essential (primary) hypertension: Secondary | ICD-10-CM | POA: Diagnosis not present

## 2022-10-11 DIAGNOSIS — Z122 Encounter for screening for malignant neoplasm of respiratory organs: Secondary | ICD-10-CM | POA: Diagnosis not present

## 2022-10-11 DIAGNOSIS — F17210 Nicotine dependence, cigarettes, uncomplicated: Secondary | ICD-10-CM | POA: Diagnosis not present

## 2022-11-25 DIAGNOSIS — I10 Essential (primary) hypertension: Secondary | ICD-10-CM | POA: Diagnosis not present

## 2022-11-25 DIAGNOSIS — I700 Atherosclerosis of aorta: Secondary | ICD-10-CM | POA: Diagnosis not present

## 2022-11-25 DIAGNOSIS — J449 Chronic obstructive pulmonary disease, unspecified: Secondary | ICD-10-CM | POA: Diagnosis not present

## 2022-11-25 DIAGNOSIS — K219 Gastro-esophageal reflux disease without esophagitis: Secondary | ICD-10-CM | POA: Diagnosis not present

## 2022-11-25 DIAGNOSIS — E782 Mixed hyperlipidemia: Secondary | ICD-10-CM | POA: Diagnosis not present

## 2022-11-25 DIAGNOSIS — I25110 Atherosclerotic heart disease of native coronary artery with unstable angina pectoris: Secondary | ICD-10-CM | POA: Diagnosis not present

## 2023-01-03 DIAGNOSIS — Z0000 Encounter for general adult medical examination without abnormal findings: Secondary | ICD-10-CM | POA: Diagnosis not present

## 2023-01-03 DIAGNOSIS — J449 Chronic obstructive pulmonary disease, unspecified: Secondary | ICD-10-CM | POA: Diagnosis not present

## 2023-01-03 DIAGNOSIS — I25110 Atherosclerotic heart disease of native coronary artery with unstable angina pectoris: Secondary | ICD-10-CM | POA: Diagnosis not present

## 2023-01-03 DIAGNOSIS — I700 Atherosclerosis of aorta: Secondary | ICD-10-CM | POA: Diagnosis not present

## 2023-01-03 DIAGNOSIS — E782 Mixed hyperlipidemia: Secondary | ICD-10-CM | POA: Diagnosis not present

## 2023-01-03 DIAGNOSIS — K219 Gastro-esophageal reflux disease without esophagitis: Secondary | ICD-10-CM | POA: Diagnosis not present

## 2023-01-03 DIAGNOSIS — I10 Essential (primary) hypertension: Secondary | ICD-10-CM | POA: Diagnosis not present

## 2023-01-10 ENCOUNTER — Encounter (INDEPENDENT_AMBULATORY_CARE_PROVIDER_SITE_OTHER): Payer: Self-pay | Admitting: *Deleted

## 2023-01-11 DIAGNOSIS — R911 Solitary pulmonary nodule: Secondary | ICD-10-CM | POA: Diagnosis not present

## 2023-01-11 DIAGNOSIS — F17210 Nicotine dependence, cigarettes, uncomplicated: Secondary | ICD-10-CM | POA: Diagnosis not present

## 2023-01-17 DIAGNOSIS — Z961 Presence of intraocular lens: Secondary | ICD-10-CM | POA: Diagnosis not present

## 2023-02-22 ENCOUNTER — Institutional Professional Consult (permissible substitution): Payer: 59 | Admitting: Pulmonary Disease

## 2023-02-22 ENCOUNTER — Encounter: Payer: Self-pay | Admitting: Pulmonary Disease

## 2023-02-22 VITALS — BP 120/80 | HR 61 | Ht 67.0 in | Wt 201.0 lb

## 2023-02-22 DIAGNOSIS — R918 Other nonspecific abnormal finding of lung field: Secondary | ICD-10-CM | POA: Diagnosis not present

## 2023-02-22 DIAGNOSIS — F17210 Nicotine dependence, cigarettes, uncomplicated: Secondary | ICD-10-CM | POA: Diagnosis not present

## 2023-02-22 MED ORDER — BUPROPION HCL ER (SR) 150 MG PO TB12: 150.0000 mg | ORAL_TABLET | Freq: Two times a day (BID) | ORAL | 2 refills | Status: DC

## 2023-02-22 NOTE — Progress Notes (Signed)
Smoking Cessation Counseling:   The patient's current tobacco use: 1 pppd, 60 years  The patient was advised to quit and impact of smoking on their health.  I assessed the patient's willingness to attempt to quit. I provided methods and skills for cessation. We reviewed medication management of smoking session drugs if appropriate. Resources to help quit smoking were provided. A smoking cessation quit date was set: 10/2/ Follow-up was arranged in our clinic.  The amount of time spent counseling patient was 4 mins    Josephine Igo, DO [facility] 02/22/2023 11:40 AM

## 2023-02-22 NOTE — Progress Notes (Deleted)
Synopsis: Referred in *** for *** by Toma Deiters, MD  Subjective:   PATIENT ID: Paul Randall GENDER: male DOB: 1945/08/14, MRN: [id number]  Chief Complaint  Patient presents with   Consult    Consult on lung nodule.    HPI  ***  Past Medical History:  Diagnosis Date   COPD (chronic obstructive pulmonary disease) (HCC)    Hypertension      No family history on file.   Past Surgical History:  Procedure Laterality Date   APPENDECTOMY     CATARACT EXTRACTION W/PHACO Right 06/06/2022   Procedure: CATARACT EXTRACTION PHACO AND INTRAOCULAR LENS PLACEMENT (IOC);  Surgeon: Fabio Pierce, MD;  Location: [facility];  Service: Ophthalmology;  Laterality: Right;  CDE 17.08   CATARACT EXTRACTION W/PHACO Left 06/24/2022   Procedure: CATARACT EXTRACTION PHACO AND INTRAOCULAR LENS PLACEMENT (IOC);  Surgeon: Fabio Pierce, MD;  Location: [facility];  Service: Ophthalmology;  Laterality: Left;  CDE 15.29   HEMORRHOID SURGERY     NECK SURGERY     c2-7    Social History   Socioeconomic History   Marital status: Widowed    Spouse name: Not on file   Number of children: Not on file   Years of education: Not on file   Highest education level: Not on file  Occupational History   Not on file  Tobacco Use   Smoking status: Every Day    Current packs/day: 1.00    Average packs/day: 1 pack/day for 66.0 years (66.0 ttl pk-yrs)    Types: Cigarettes   Smokeless tobacco: Not on file   Tobacco comments:    Smokes 1/2 a pack of cigarettes a day. 02/22/23 Tay  Vaping Use   Vaping status: Never Used  Substance and Sexual Activity   Alcohol use: Not Currently   Drug use: Not Currently   Sexual activity: Not Currently  Other Topics Concern   Not on file  Social History Narrative   Not on file   Social Determinants of Health   Financial Resource Strain: Not on file  Food Insecurity: Not on file  Transportation Needs: Not on file  Physical Activity: Not on file  Stress: Not on file   Social Connections: Not on file  Intimate Partner Violence: Not on file     No Known Allergies   Outpatient Medications Prior to Visit  Medication Sig Dispense Refill   aspirin 81 MG EC tablet Take by mouth.     atorvastatin (LIPITOR) 40 MG tablet Take 40 mg by mouth daily.     Budeson-Glycopyrrol-Formoterol (BREZTRI AEROSPHERE) 160-9-4.8 MCG/ACT AERO Take 2 puffs first thing in am and then another 2 puffs about 12 hours later. 10.7 g 11   isosorbide mononitrate (IMDUR) 30 MG 24 hr tablet Take 30 mg by mouth daily.     loratadine (CLARITIN) 10 MG tablet Take 10 mg by mouth daily.     metoprolol succinate (TOPROL-XL) 25 MG 24 hr tablet Take 25 mg by mouth daily.     naproxen sodium (ALEVE) 220 MG tablet Take 220 mg by mouth.     Omega-3 Fatty Acids (FISH OIL) 1000 MG CAPS Take by mouth.     omeprazole (PRILOSEC) 20 MG capsule Take 1 capsule (20 mg total) by mouth daily. 30 capsule 0   No facility-administered medications prior to visit.    ROS   Objective:  Physical Exam   Vitals:   02/22/23 1120  BP: 120/80  Pulse: 61  SpO2: 94%  Weight: 201 lb (91.2 kg)  Height: 5\' 7"  (1.702 m)   94% on *** LPM *** RA BMI Readings from Last 3 Encounters:  02/22/23 31.48 kg/m  06/22/22 32.04 kg/m  06/06/22 32.04 kg/m   Wt Readings from Last 3 Encounters:  02/22/23 201 lb (91.2 kg)  06/22/22 204 lb 9.4 oz (92.8 kg)  06/06/22 204 lb 9.4 oz (92.8 kg)     CBC    Component Value Date/Time   WBC 9.0 10/26/2021 1344   RBC 5.26 10/26/2021 1344   HGB 16.5 10/26/2021 1344   HCT 50.2 10/26/2021 1344   PLT 174 10/26/2021 1344   MCV 95.4 10/26/2021 1344   MCH 31.4 10/26/2021 1344   MCHC 32.9 10/26/2021 1344   RDW 13.2 10/26/2021 1344    ***  Chest Imaging: ***  Pulmonary Functions Testing Results:    Latest Ref Rng & Units 11/18/2021   12:36 PM  PFT Results  FVC-Pre L 3.82   FVC-Predicted Pre % 99   FVC-Post L 3.78   FVC-Predicted Post % 98   Pre FEV1/FVC % % 51    Post FEV1/FCV % % 51   FEV1-Pre L 1.94   FEV1-Predicted Pre % 70   FEV1-Post L 1.94   DLCO uncorrected ml/min/mmHg 12.39   DLCO [facility]% % 53   DLVA Predicted % 69   TLC L 9.89   TLC % Predicted % 153   RV % Predicted % 247     FeNO: ***  Pathology: ***  Echocardiogram: ***  Heart Catheterization: ***    Assessment & Plan:   No diagnosis found.  Discussion: ***   Current Outpatient Medications:    aspirin 81 MG EC tablet, Take by mouth., Disp: , Rfl:    atorvastatin (LIPITOR) 40 MG tablet, Take 40 mg by mouth daily., Disp: , Rfl:    Budeson-Glycopyrrol-Formoterol (BREZTRI AEROSPHERE) 160-9-4.8 MCG/ACT AERO, Take 2 puffs first thing in am and then another 2 puffs about 12 hours later., Disp: 10.7 g, Rfl: 11   isosorbide mononitrate (IMDUR) 30 MG 24 hr tablet, Take 30 mg by mouth daily., Disp: , Rfl:    loratadine (CLARITIN) 10 MG tablet, Take 10 mg by mouth daily., Disp: , Rfl:    metoprolol succinate (TOPROL-XL) 25 MG 24 hr tablet, Take 25 mg by mouth daily., Disp: , Rfl:    naproxen sodium (ALEVE) 220 MG tablet, Take 220 mg by mouth., Disp: , Rfl:    Omega-3 Fatty Acids (FISH OIL) 1000 MG CAPS, Take by mouth., Disp: , Rfl:    omeprazole (PRILOSEC) 20 MG capsule, Take 1 capsule (20 mg total) by mouth daily., Disp: 30 capsule, Rfl: 0  I spent *** minutes dedicated to the care of this patient on the date of this encounter to include pre-visit review of records, face-to-face time with the patient discussing conditions above, post visit ordering of testing, clinical documentation with the electronic health record, making appropriate referrals as documented, and communicating necessary findings to members of the patients care team.   Josephine Igo, DO [facility] 02/22/2023 11:38 AM

## 2023-02-22 NOTE — Patient Instructions (Signed)
Thank you for visiting Dr. Tonia Brooms at [facility]. Today we recommend the following:  Orders Placed This Encounter  Procedures   CT Chest Wo Contrast   Return in about 3 months (around 05/25/2023) for with APP or Dr. Tonia Brooms, after CT Chest.    Please do your part to reduce the spread of COVID-19.

## 2023-02-22 NOTE — Progress Notes (Signed)
Synopsis: Referred in Aug 2024 for abnormal lung cancer screening CT by Toma Deiters, MD  Subjective:   PATIENT ID: Paul Randall GENDER: male DOB: 09-24-1945, MRN: [id number]  Chief Complaint  Patient presents with   Consult    Consult on lung nodule.    This is a 77 year old gentleman seen today with a past medical history of COPD and hypertension.  He is a longstanding smoker.  He has smoked for 60+ years.Patient had an abnormal lung cancer screening CT completed at [facility].  Was found to have multiple scattered small pulmonary nodules some new and some enlarging.  Was read as a lung RADS 4B.  Other options could consider underlying NTM.  Has diffuse paraseptal emphysema.     Past Medical History:  Diagnosis Date   COPD (chronic obstructive pulmonary disease) (HCC)    Hypertension      No family history on file.   Past Surgical History:  Procedure Laterality Date   APPENDECTOMY     CATARACT EXTRACTION W/PHACO Right 06/06/2022   Procedure: CATARACT EXTRACTION PHACO AND INTRAOCULAR LENS PLACEMENT (IOC);  Surgeon: Fabio Pierce, MD;  Location: [facility];  Service: Ophthalmology;  Laterality: Right;  CDE 17.08   CATARACT EXTRACTION W/PHACO Left 06/24/2022   Procedure: CATARACT EXTRACTION PHACO AND INTRAOCULAR LENS PLACEMENT (IOC);  Surgeon: Fabio Pierce, MD;  Location: [facility];  Service: Ophthalmology;  Laterality: Left;  CDE 15.29   HEMORRHOID SURGERY     NECK SURGERY     c2-7    Social History   Socioeconomic History   Marital status: Widowed    Spouse name: Not on file   Number of children: Not on file   Years of education: Not on file   Highest education level: Not on file  Occupational History   Not on file  Tobacco Use   Smoking status: Every Day    Current packs/day: 1.00    Average packs/day: 1 pack/day for 66.0 years (66.0 ttl pk-yrs)    Types: Cigarettes   Smokeless tobacco: Not on file   Tobacco comments:    Smokes 1/2 a pack of cigarettes a  day. 02/22/23 Tay  Vaping Use   Vaping status: Never Used  Substance and Sexual Activity   Alcohol use: Not Currently   Drug use: Not Currently   Sexual activity: Not Currently  Other Topics Concern   Not on file  Social History Narrative   Not on file   Social Determinants of Health   Financial Resource Strain: Not on file  Food Insecurity: Not on file  Transportation Needs: Not on file  Physical Activity: Not on file  Stress: Not on file  Social Connections: Not on file  Intimate Partner Violence: Not on file     No Known Allergies   Outpatient Medications Prior to Visit  Medication Sig Dispense Refill   aspirin 81 MG EC tablet Take by mouth.     atorvastatin (LIPITOR) 40 MG tablet Take 40 mg by mouth daily.     Budeson-Glycopyrrol-Formoterol (BREZTRI AEROSPHERE) 160-9-4.8 MCG/ACT AERO Take 2 puffs first thing in am and then another 2 puffs about 12 hours later. 10.7 g 11   isosorbide mononitrate (IMDUR) 30 MG 24 hr tablet Take 30 mg by mouth daily.     loratadine (CLARITIN) 10 MG tablet Take 10 mg by mouth daily.     metoprolol succinate (TOPROL-XL) 25 MG 24 hr tablet Take 25 mg by mouth daily.     naproxen sodium (  ALEVE) 220 MG tablet Take 220 mg by mouth.     Omega-3 Fatty Acids (FISH OIL) 1000 MG CAPS Take by mouth.     omeprazole (PRILOSEC) 20 MG capsule Take 1 capsule (20 mg total) by mouth daily. 30 capsule 0   No facility-administered medications prior to visit.    Review of Systems  Constitutional:  Negative for chills, fever, malaise/fatigue and weight loss.  HENT:  Negative for hearing loss, sore throat and tinnitus.   Eyes:  Negative for blurred vision and double vision.  Respiratory:  Positive for cough and shortness of breath. Negative for hemoptysis, sputum production, wheezing and stridor.   Cardiovascular:  Negative for chest pain, palpitations, orthopnea, leg swelling and PND.  Gastrointestinal:  Negative for abdominal pain, constipation, diarrhea,  heartburn, nausea and vomiting.  Genitourinary:  Negative for dysuria, hematuria and urgency.  Musculoskeletal:  Negative for joint pain and myalgias.  Skin:  Negative for itching and rash.  Neurological:  Negative for dizziness, tingling, weakness and headaches.  Endo/Heme/Allergies:  Negative for environmental allergies. Does not bruise/bleed easily.  Psychiatric/Behavioral:  Negative for depression. The patient is not nervous/anxious and does not have insomnia.   All other systems reviewed and are negative.    Objective:  Physical Exam Vitals reviewed.  Constitutional:      General: He is not in acute distress.    Appearance: He is well-developed.  HENT:     Head: Normocephalic and atraumatic.  Eyes:     General: No scleral icterus.    Conjunctiva/sclera: Conjunctivae normal.     Pupils: Pupils are equal, round, and reactive to light.  Neck:     Vascular: No JVD.     Trachea: No tracheal deviation.  Cardiovascular:     Rate and Rhythm: Normal rate and regular rhythm.     Heart sounds: Normal heart sounds. No murmur heard. Pulmonary:     Effort: Pulmonary effort is normal. No tachypnea, accessory muscle usage or respiratory distress.     Breath sounds: No stridor. No wheezing, rhonchi or rales.  Abdominal:     General: There is no distension.     Palpations: Abdomen is soft.     Tenderness: There is no abdominal tenderness.  Musculoskeletal:        General: No tenderness.     Cervical back: Neck supple.  Lymphadenopathy:     Cervical: No cervical adenopathy.  Skin:    General: Skin is warm and dry.     Capillary Refill: Capillary refill takes less than 2 seconds.     Findings: No rash.  Neurological:     Mental Status: He is alert and oriented to person, place, and time.  Psychiatric:        Behavior: Behavior normal.      Vitals:   02/22/23 1120  BP: 120/80  Pulse: 61  SpO2: 94%  Weight: 201 lb (91.2 kg)  Height: 5\' 7"  (1.702 m)   94% on RA BMI  Readings from Last 3 Encounters:  02/22/23 31.48 kg/m  06/22/22 32.04 kg/m  06/06/22 32.04 kg/m   Wt Readings from Last 3 Encounters:  02/22/23 201 lb (91.2 kg)  06/22/22 204 lb 9.4 oz (92.8 kg)  06/06/22 204 lb 9.4 oz (92.8 kg)     CBC    Component Value Date/Time   WBC 9.0 10/26/2021 1344   RBC 5.26 10/26/2021 1344   HGB 16.5 10/26/2021 1344   HCT 50.2 10/26/2021 1344   PLT 174 10/26/2021 1344  MCV 95.4 10/26/2021 1344   MCH 31.4 10/26/2021 1344   MCHC 32.9 10/26/2021 1344   RDW 13.2 10/26/2021 1344     Chest Imaging:  Lung cancer screening CT [facility]: Unable to view images however report has multiple pulmonary nodules listed. The patient's images have been independently reviewed by me.    Pulmonary Functions Testing Results:    Latest Ref Rng & Units 11/18/2021   12:36 PM  PFT Results  FVC-Pre L 3.82   FVC-Predicted Pre % 99   FVC-Post L 3.78   FVC-Predicted Post % 98   Pre FEV1/FVC % % 51   Post FEV1/FCV % % 51   FEV1-Pre L 1.94   FEV1-Predicted Pre % 70   FEV1-Post L 1.94   DLCO uncorrected ml/min/mmHg 12.39   DLCO [facility]% % 53   DLVA Predicted % 69   TLC L 9.89   TLC % Predicted % 153   RV % Predicted % 247     FeNO:   Pathology:   Echocardiogram:   Heart Catheterization:     Assessment & Plan:     ICD-10-CM   1. Lung nodules  R91.8 CT Chest Wo Contrast    2. Abnormal CT lung screening  R91.8       Discussion:  This is a 77 year old gentleman seen today for multiple pulmonary nodules and abnormal lung cancer screening CT.  Plan: Unfortunately our office did not request images to be seen with the patient today. I can read the report but unable to view the images to decide if we should do anything further. At minimum he needs at least a 73-month CT scan follow-up. Especially with his history of smoking. The largest nodule is documented as having a mean derived volume of 9.2 mm. He also has other evidence of MAI.  We will  request to have images pushed and once I am notified that they are available to be viewed may change my mind on CT image follow-up or consideration for pet imaging once I can see what to do next.     Current Outpatient Medications:    aspirin 81 MG EC tablet, Take by mouth., Disp: , Rfl:    atorvastatin (LIPITOR) 40 MG tablet, Take 40 mg by mouth daily., Disp: , Rfl:    Budeson-Glycopyrrol-Formoterol (BREZTRI AEROSPHERE) 160-9-4.8 MCG/ACT AERO, Take 2 puffs first thing in am and then another 2 puffs about 12 hours later., Disp: 10.7 g, Rfl: 11   buPROPion (WELLBUTRIN SR) 150 MG 12 hr tablet, Take 1 tablet (150 mg total) by mouth 2 (two) times daily., Disp: 60 tablet, Rfl: 2   isosorbide mononitrate (IMDUR) 30 MG 24 hr tablet, Take 30 mg by mouth daily., Disp: , Rfl:    loratadine (CLARITIN) 10 MG tablet, Take 10 mg by mouth daily., Disp: , Rfl:    metoprolol succinate (TOPROL-XL) 25 MG 24 hr tablet, Take 25 mg by mouth daily., Disp: , Rfl:    naproxen sodium (ALEVE) 220 MG tablet, Take 220 mg by mouth., Disp: , Rfl:    Omega-3 Fatty Acids (FISH OIL) 1000 MG CAPS, Take by mouth., Disp: , Rfl:    omeprazole (PRILOSEC) 20 MG capsule, Take 1 capsule (20 mg total) by mouth daily., Disp: 30 capsule, Rfl: 0    Josephine Igo, DO [facility] 02/22/2023 5:12 PM

## 2023-03-12 NOTE — Progress Notes (Unsigned)
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   When moved to [facility] 2020 weighed 140 s sob   History of Present Illness  09/16/2021  Pulmonary/ 1st office eval/ [name] / [place] [facility]  Chief Complaint  Patient presents with   Consult    Patient had CT done and nodules noted. Brought CT CD   Dyspnea:  onset with wt gain - walks across parking lot/ no problem slow pace, main problem is when bend over  - ex tol better p trelegy  Cough: some with ex / variably discolored never bloody  Sleep: insomnia/ flat bed / 2 pillows  SABA use: spiriva respimat > not helpful  Rec The key is to stop smoking completely before smoking completely stops you! For nasty mucus > zpak  (refillable) For thick mucus/ congested cough >  add mucinex dm (over the counter) 1200 mg every 12 hours as needed  We will walk you here for a baseline No change trelegy once daily  We will see you back here after you have PFTs at [facility]    01/10/2022  f/u ov/[place] office/[name] re: GOLD 2 maint on trelegy and prn zpak cough is the main concern  Chief Complaint  Patient presents with   Follow-up    Pft done in May Breathing is about the same.   Dyspnea:  struggle to do weed eating more 20-30 min  Cough: clear  esp p supper and hs  Sleeping: level bed/ 2pillows  SABA use: none  02: none  Covid status: vaxx 2  Lung cancer screening: [place] per hasanj Rec Stop the trelegy  Start Breztri Take 2 puffs first thing in am and then another 2 puffs about 12 hours later.   Work on inhaler technique: Change omeprazole to where you take it Take 30- 60 min before your first and last meals of the day until cough is gone then back to Take 30-60 min before first meal of the day The key is to stop smoking completely before smoking  completely stops you!    04/18/2022  f/u ov/[place] office/[name] re: copd 2 / active smoker  maint on breztri   Chief Complaint  Patient presents with   Follow-up    Patient feels he has improved since last ov    Dyspnea:  installed metal roof last week / ladders ok  Cough: variable sometimes with ex and has but not as bad  Sleeping: level bed 2 pillows  SABA use: once or twice a month 02: none  Lung cancer screening: DR Hasanaj already doing q feb Rec Work on inhaler technique:  The key is to stop smoking completely before smoking completely stops you!   04/18/22   Alpha one MM/  Level 160  03/13/2023 12 m  f/u ov/[place] office/[name] re: COPD GOLD 2 / MPNs maint on breztri   Chief Complaint  Patient presents with   COPD    Gold 2; still smoking   Dyspnea: "breathing fine as long as sitting still and not bending over =  3 aisles at [employer]/ no hc parking  Cough: slt smoker's rattle sometimes light green Sleeping: level bed, 2 pillows s    resp cc  SABA use: once or twice daily  02: none   Lung  cancer screening: Dr Olena Leatherwood    No obvious day to day or daytime variability or assoc   mucus plugs or hemoptysis or cp or chest tightness, subjective wheeze or overt sinus or hb symptoms.    Also denies any obvious fluctuation of symptoms with weather or environmental changes or other aggravating or alleviating factors except as outlined above   No unusual exposure hx or h/o childhood pna/ asthma or knowledge of premature birth.  Current Allergies, Complete Past Medical History, Past Surgical History, Family History, and Social History were reviewed in [employer] record.  ROS  The following are not active complaints unless bolded Hoarseness, sore throat, dysphagia, dental problems, itching, sneezing,  nasal congestion or discharge of excess mucus or purulent secretions, ear ache,   fever, chills, sweats, unintended wt loss or wt gain, classically pleuritic  or exertional cp,  orthopnea pnd or arm/hand swelling  or leg swelling, presyncope, palpitations, abdominal pain, anorexia, nausea, vomiting, diarrhea  or change in bowel habits or change in bladder habits, change in stools or change in urine, dysuria, hematuria,  rash, arthralgias, visual complaints, headache, numbness, weakness or ataxia or problems with walking or coordination,  change in mood or  memory.        Current Meds  Medication Sig   albuterol (VENTOLIN HFA) 108 (90 Base) MCG/ACT inhaler Inhale 2 puffs into the lungs every 6 (six) hours as needed.   aspirin 81 MG EC tablet Take by mouth.   atorvastatin (LIPITOR) 40 MG tablet Take 40 mg by mouth daily.   Budeson-Glycopyrrol-Formoterol (BREZTRI AEROSPHERE) 160-9-4.8 MCG/ACT AERO Take 2 puffs first thing in am and then another 2 puffs about 12 hours later.   buPROPion (WELLBUTRIN SR) 150 MG 12 hr tablet Take 1 tablet (150 mg total) by mouth 2 (two) times daily.   isosorbide mononitrate (IMDUR) 30 MG 24 hr tablet Take 30 mg by mouth daily.   loratadine (CLARITIN) 10 MG tablet Take 10 mg by mouth daily.   metoprolol succinate (TOPROL-XL) 25 MG 24 hr tablet Take 25 mg by mouth daily.   naproxen sodium (ALEVE) 220 MG tablet Take 220 mg by mouth.   olmesartan (BENICAR) 5 MG tablet Take 5 mg by mouth daily.   Omega-3 Fatty Acids (FISH OIL) 1000 MG CAPS Take by mouth.   omeprazole (PRILOSEC) 20 MG capsule Take 1 capsule (20 mg total) by mouth daily.             Objective:     03/13/2023      198   04/18/2022    204   01/10/22 201 lb 9.6 oz (91.4 kg)  10/26/21 196 lb (88.9 kg)  09/16/21 197 lb 6.4 oz (89.5 kg)    Vital signs reviewed  03/13/2023  - Note at rest 02 sats  94% on RA   General appearance:    pleasant amb wm nad    HEENT : Oropharynx  clear   Nasal turbinates nl    NECK :  without  apparent JVD/ palpable Nodes/TM    LUNGS: no acc muscle use,  Min barrel  contour chest wall with bilateral  slightly decreased bs s  audible wheeze and  without cough on insp or exp maneuvers and min  Hyperresonant  to  percussion bilaterally    CV:  RRR  no s3 or murmur or increase in P2, and no edema   ABD:  soft and nontender with pos end  insp Hoover's  in the supine  position.  No bruits or organomegaly appreciated   MS:  Nl gait/ ext warm without deformities Or obvious joint restrictions  calf tenderness, cyanosis or clubbing     SKIN: warm and dry without lesions    NEURO:  alert, approp, nl sensorium with  no motor or cerebellar deficits apparent.              Assessment

## 2023-03-13 ENCOUNTER — Ambulatory Visit (INDEPENDENT_AMBULATORY_CARE_PROVIDER_SITE_OTHER): Payer: 59 | Admitting: Internal Medicine

## 2023-03-13 ENCOUNTER — Encounter: Payer: Self-pay | Admitting: Internal Medicine

## 2023-03-13 VITALS — BP 147/81 | HR 74 | Ht 67.0 in | Wt 198.0 lb

## 2023-03-13 DIAGNOSIS — F17210 Nicotine dependence, cigarettes, uncomplicated: Secondary | ICD-10-CM

## 2023-03-13 DIAGNOSIS — J449 Chronic obstructive pulmonary disease, unspecified: Secondary | ICD-10-CM

## 2023-03-13 DIAGNOSIS — F4312 Post-traumatic stress disorder, chronic: Secondary | ICD-10-CM | POA: Insufficient documentation

## 2023-03-13 DIAGNOSIS — R918 Other nonspecific abnormal finding of lung field: Secondary | ICD-10-CM | POA: Insufficient documentation

## 2023-03-13 DIAGNOSIS — F1010 Alcohol abuse, uncomplicated: Secondary | ICD-10-CM | POA: Insufficient documentation

## 2023-03-13 MED ORDER — AZITHROMYCIN 250 MG PO TABS: ORAL_TABLET | ORAL | 11 refills | Status: AC

## 2023-03-13 MED ORDER — AZITHROMYCIN 250 MG PO TABS: ORAL_TABLET | ORAL | 0 refills | Status: DC

## 2023-03-13 NOTE — Assessment & Plan Note (Signed)
4-5 min discussion re active cigarette smoking in addition to office E&M ? ?Ask about tobacco use:   ongoing ?Advise quitting  I took an extended  opportunity with this patient to outline the consequences of continued cigarette use  in airway disorders based on all the data we have from the multiple national lung health studies (perfomed over decades at millions of dollars in cost)  indicating that smoking cessation, not choice of inhalers or pulmonaryphysicians, is the most important aspect of his care.   ?Assess willingness:  Not committed at this point ?Assist in quit attempt:  Per PCP when ready ?Arrange follow up:   Follow up per Primary Care planned  ?  ?  ?  ?

## 2023-03-13 NOTE — Assessment & Plan Note (Signed)
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  08/03/21  - 09/16/2021   Walked on RA 3  x  3  lap(s) =  approx 450  ft  @ fast pace, stopped due to end of study  with lowest 02 sats 94% and sob on 3rd lap  - 09/16/2021  After extensive coaching inhaler device,  effectiveness =    90% > continue trelegy 100 q am  - PFT's  11/18/21   FEV1 1.94 (69 % ) ratio 0.51  p 0 % improvement from saba p trelegy prior to study with   FV curve classic concavity   - 01/10/2022   try breztri instead of trelegy and take ppi bid until cough better then just q am a - 04/18/22   Alpha one MM/  Level 160 - 03/13/2023  After extensive coaching inhaler device,  effectiveness =    50% (short Ti)> contineu breztri and approp saba  - 03/13/2023 z pak prn  - 03/13/2023   Walked on RA  x  3  lap(s) =  approx 450  ft  @ mod pace, stopped due to end of stusy  with lowest 02 sats 94%     Group D (now reclassified as E) in terms of symptom/risk and laba/lama/ICS  therefore appropriate rx at this point >>>  breztri plus approp saba (prefer use before, not after ex)   Each maintenance medication was reviewed in detail including emphasizing most importantly the difference between maintenance and prns and under what circumstances the prns are to be triggered using an action plan format where appropriate.  Total time for H and P, chart review, counseling, reviewing hfa device(s) , directly observing portions of ambulatory 02 saturation study/ and generating customized AVS unique to this office visit / same day charting = 30 min

## 2023-03-13 NOTE — Patient Instructions (Addendum)
Plan A = Automatic = Always=    Breztri Take 2 puffs first thing in am and then another 2 puffs about 12 hours later.    Work on inhaler technique:  relax and gently blow all the way out then take a nice smooth full deep breath back in, triggering the inhaler at same time you start breathing in.  Hold breath in for at least  5 seconds if you can. Blow out breztri  thru nose. Rinse and gargle with water when done.  If mouth or throat bother you at all,  try brushing teeth/gums/tongue with arm and hammer toothpaste/ make a slurry and gargle and spit out.   >>>  Remember how golfers warm up by taking practice swings - do this with an empty inhaler    Plan B = Backup for breathing  (to supplement plan A, not to replace it) Only use your albuterol inhaler as a rescue medication to be used if you can't catch your breath by resting or doing a relaxed purse lip breathing pattern.  - The less you use it, the better it will work when you need it. - Ok to use the inhaler up to 2 puffs  every 4 hours if you must but call for appointment if use goes up over your usual need - Don't leave home without it !!  (think of it like the spare tire for your car)   For nasty mucus > zpak (refill and take as needed but finish  the 5 days)   The key is to stop smoking completely before smoking completely stops you!    Please schedule a follow up visit in 6 months but call sooner if needed         Plan D = Doctor - call me if B and C not adequate  Plan E = ER - go to ER or call 911 if all else fails

## 2023-03-16 ENCOUNTER — Other Ambulatory Visit: Payer: Self-pay | Admitting: Pulmonary Disease

## 2023-04-06 DIAGNOSIS — I700 Atherosclerosis of aorta: Secondary | ICD-10-CM | POA: Diagnosis not present

## 2023-04-06 DIAGNOSIS — I10 Essential (primary) hypertension: Secondary | ICD-10-CM | POA: Diagnosis not present

## 2023-04-06 DIAGNOSIS — K760 Fatty (change of) liver, not elsewhere classified: Secondary | ICD-10-CM | POA: Diagnosis not present

## 2023-04-06 DIAGNOSIS — J449 Chronic obstructive pulmonary disease, unspecified: Secondary | ICD-10-CM | POA: Diagnosis not present

## 2023-04-06 DIAGNOSIS — E782 Mixed hyperlipidemia: Secondary | ICD-10-CM | POA: Diagnosis not present

## 2023-04-06 DIAGNOSIS — K219 Gastro-esophageal reflux disease without esophagitis: Secondary | ICD-10-CM | POA: Diagnosis not present

## 2023-04-06 DIAGNOSIS — I25110 Atherosclerotic heart disease of native coronary artery with unstable angina pectoris: Secondary | ICD-10-CM | POA: Diagnosis not present

## 2023-04-18 ENCOUNTER — Other Ambulatory Visit (HOSPITAL_BASED_OUTPATIENT_CLINIC_OR_DEPARTMENT_OTHER): Payer: 59

## 2023-05-05 ENCOUNTER — Ambulatory Visit (HOSPITAL_BASED_OUTPATIENT_CLINIC_OR_DEPARTMENT_OTHER): Payer: 59

## 2023-05-20 ENCOUNTER — Ambulatory Visit (HOSPITAL_COMMUNITY)
Admission: RE | Admit: 2023-05-20 | Discharge: 2023-05-20 | Disposition: A | Discharge status: Home / Self Care | Payer: 59 | Source: Ambulatory Visit | Attending: Pulmonary Disease | Admitting: Pulmonary Disease

## 2023-05-20 DIAGNOSIS — R590 Localized enlarged lymph nodes: Secondary | ICD-10-CM | POA: Diagnosis not present

## 2023-05-20 DIAGNOSIS — R918 Other nonspecific abnormal finding of lung field: Secondary | ICD-10-CM | POA: Diagnosis not present

## 2023-05-20 DIAGNOSIS — J432 Centrilobular emphysema: Secondary | ICD-10-CM | POA: Diagnosis not present

## 2023-05-23 ENCOUNTER — Ambulatory Visit: Payer: 59 | Admitting: Nurse Practitioner

## 2023-05-24 ENCOUNTER — Ambulatory Visit: Payer: 59 | Admitting: Nurse Practitioner

## 2023-06-05 NOTE — Progress Notes (Signed)
Seeing KC in Jan for follow up. Nodules stable. Needs 6 months ct follow up.   Thanks,  BLI  Josephine Igo, DO [facility] 06/05/2023 7:47 AM

## 2023-07-10 DIAGNOSIS — Z0000 Encounter for general adult medical examination without abnormal findings: Secondary | ICD-10-CM | POA: Diagnosis not present

## 2023-07-10 DIAGNOSIS — E782 Mixed hyperlipidemia: Secondary | ICD-10-CM | POA: Diagnosis not present

## 2023-07-10 DIAGNOSIS — I25110 Atherosclerotic heart disease of native coronary artery with unstable angina pectoris: Secondary | ICD-10-CM | POA: Diagnosis not present

## 2023-07-10 DIAGNOSIS — K760 Fatty (change of) liver, not elsewhere classified: Secondary | ICD-10-CM | POA: Diagnosis not present

## 2023-07-10 DIAGNOSIS — J449 Chronic obstructive pulmonary disease, unspecified: Secondary | ICD-10-CM | POA: Diagnosis not present

## 2023-07-10 DIAGNOSIS — I10 Essential (primary) hypertension: Secondary | ICD-10-CM | POA: Diagnosis not present

## 2023-07-10 DIAGNOSIS — I700 Atherosclerosis of aorta: Secondary | ICD-10-CM | POA: Diagnosis not present

## 2023-07-10 DIAGNOSIS — K219 Gastro-esophageal reflux disease without esophagitis: Secondary | ICD-10-CM | POA: Diagnosis not present

## 2023-07-13 ENCOUNTER — Encounter (INDEPENDENT_AMBULATORY_CARE_PROVIDER_SITE_OTHER): Payer: Self-pay | Admitting: *Deleted

## 2023-07-17 ENCOUNTER — Ambulatory Visit: Payer: 59 | Admitting: Nurse Practitioner

## 2023-07-17 ENCOUNTER — Encounter: Payer: Self-pay | Admitting: Nurse Practitioner

## 2023-07-17 VITALS — BP 120/62 | HR 74 | Ht 67.5 in | Wt 198.4 lb

## 2023-07-17 DIAGNOSIS — F17210 Nicotine dependence, cigarettes, uncomplicated: Secondary | ICD-10-CM | POA: Diagnosis not present

## 2023-07-17 DIAGNOSIS — J449 Chronic obstructive pulmonary disease, unspecified: Secondary | ICD-10-CM

## 2023-07-17 DIAGNOSIS — R918 Other nonspecific abnormal finding of lung field: Secondary | ICD-10-CM

## 2023-07-17 DIAGNOSIS — R053 Chronic cough: Secondary | ICD-10-CM | POA: Diagnosis not present

## 2023-07-17 NOTE — Progress Notes (Signed)
@Patient  ID: Paul Randall, male    DOB: [birth year], 78 y.o.   MRN: [id number]  Chief Complaint  Patient presents with   Follow-up    Referring provider: Toma Deiters, MD  HPI: 78 year old male, active smoker followed for COPD with Dr. Sherene Sires and lung nodules with Dr. Tonia Brooms.  Last seen in office 03/13/2023.  Past medical history significant for CAD, hypertension, EtOH abuse, PTSD, HLD.  TEST/EVENTS:  11/18/2021 PFT: FVC 99, FEV1 70, ratio 51, TLC 153, DLCO 53.  No BD.  Moderate obstruction without reversibility, air trapping and reduced diffusing capacity 05/20/2023 CT chest: Atherosclerosis/CAD.  Stable 1 cm precarinal lymph node.  Mildly prominent lymph nodes in axillary regions, similar to prior.  Centrilobular and paraseptal of edema.  Stable 5 mm nodule in right upper lobe.  Scattered irregular nodular densities throughout the lungs, likely postinflammatory.  Few new nodules, largest of which is 6 mm.  02/22/2023: OV with Dr. Tonia Brooms.  Longstanding smoker.  Had an abnormal lung cancer screening CT at [facility] that was read as lung RADS 4B.  Found to have multiple scattered pulmonary nodules some new and some enlarging.  Request scan as images were unavailable.  Largest nodule documented was 9.2 mm.  Also has other evidence of possible MAI.  Plan to repeat CT chest in 3 months pending review of imaging.  07/17/2023: Today - follow up Discussed the use of AI scribe software for clinical note transcription with the patient, who gave verbal consent to proceed.  History of Present Illness   The patient, with a history of COPD and lung nodules, presented for a follow-up visit after a recent CT scan. The scan revealed a new 6 mm nodule in the left lower lobe of the lungs, which was not well visualized in the previous scan.  The patient denies hemoptysis, weight loss, anorexia, night sweats. Breathing was reported to be generally okay. The patient was using Breztri twice a day and a rescue  inhaler approximately twice a week. He reported occasional wheezing that resolved on its own. He has a chronic cough. Occasionally produces green phlegm but it's primarily clear. No fevers, chills.  The patient continues to smoke, although he had reduced his intake from two packs a day to about eight or nine cigarettes a day. He had tried various methods to quit smoking in the past, including patches and gum, but had experienced adverse reactions or found them ineffective. He is currently on Wellbutrin.       No Known Allergies  Immunization History  Administered Date(s) Administered   Influenza-Unspecified 02/26/2012, 02/25/2013   Pneumococcal Polysaccharide-23 10/13/2010, 10/14/2010   Zoster, Live 10/14/2010    Past Medical History:  Diagnosis Date   COPD (chronic obstructive pulmonary disease) (HCC)    Hypertension     Tobacco History: Social History   Tobacco Use  Smoking Status Every Day   Current packs/day: 1.00   Average packs/day: 1 pack/day for 66.0 years (66.0 ttl pk-yrs)   Types: Cigarettes  Smokeless Tobacco Not on file  Tobacco Comments   Smokes 1/2 a pack of cigarettes a day. 02/22/23 Tay   Ready to quit: Not Answered Counseling given: Not Answered Tobacco comments: Smokes 1/2 a pack of cigarettes a day. 02/22/23 Tay   Outpatient Medications Prior to Visit  Medication Sig Dispense Refill   albuterol (VENTOLIN HFA) 108 (90 Base) MCG/ACT inhaler Inhale 2 puffs into the lungs every 6 (six) hours as needed.     aspirin 81  MG EC tablet Take by mouth.     atorvastatin (LIPITOR) 40 MG tablet Take 40 mg by mouth daily.     Budeson-Glycopyrrol-Formoterol (BREZTRI AEROSPHERE) 160-9-4.8 MCG/ACT AERO Take 2 puffs first thing in am and then another 2 puffs about 12 hours later. 10.7 g 11   buPROPion (WELLBUTRIN SR) 150 MG 12 hr tablet TAKE 1 TABLET BY MOUTH TWICE A DAY 180 tablet 1   isosorbide mononitrate (IMDUR) 30 MG 24 hr tablet Take 30 mg by mouth daily.      loratadine (CLARITIN) 10 MG tablet Take 10 mg by mouth daily.     metoprolol succinate (TOPROL-XL) 25 MG 24 hr tablet Take 25 mg by mouth daily.     naproxen sodium (ALEVE) 220 MG tablet Take 220 mg by mouth.     olmesartan (BENICAR) 5 MG tablet Take 5 mg by mouth daily.     Omega-3 Fatty Acids (FISH OIL) 1000 MG CAPS Take by mouth.     omeprazole (PRILOSEC) 20 MG capsule Take 1 capsule (20 mg total) by mouth daily. 30 capsule 0   azithromycin (ZITHROMAX) 250 MG tablet Take 2 on day one then 1 daily x 4 days for nasty mucus as needed 6 tablet 11   No facility-administered medications prior to visit.     Review of Systems:   Constitutional: No weight loss or gain, night sweats, fevers, chills, fatigue, or lassitude. HEENT: No headaches, difficulty swallowing, tooth/dental problems, or sore throat. No sneezing, itching, ear ache, nasal congestion, or post nasal drip CV:  No chest pain, orthopnea, PND, swelling in lower extremities, anasarca, dizziness, palpitations, syncope Resp: +shortness of breath with exertion; chronic productive cough; rare wheeze. No excess mucus or change in color of mucus. No hemoptysis. No chest wall deformity GI:  No heartburn, indigestion, abdominal pain, nausea, vomiting, diarrhea, change in bowel habits, loss of appetite, bloody stools.  GU: No dysuria, change in color of urine, urgency or frequency.  No flank pain, no hematuria  Skin: No rash, lesions, ulcerations MSK:  No joint pain or swelling.   Neuro: No dizziness or lightheadedness.  Psych: No depression or anxiety. Mood stable.     Physical Exam:  BP 120/62 (BP Location: Right Arm, Patient Position: Sitting, Cuff Size: Large)   Pulse 74   Ht 5' 7.5" (1.715 m)   Wt 198 lb 6.4 oz (90 kg)   SpO2 94%   BMI 30.62 kg/m   GEN: Pleasant, interactive, well-appearing; obese; in no acute distress HEENT:  Normocephalic and atraumatic. PERRLA. Sclera white. Nasal turbinates pink, moist and patent  bilaterally. No rhinorrhea present. Oropharynx pink and moist, without exudate or edema. No lesions, ulcerations, or postnasal drip.  NECK:  Supple w/ fair ROM. No JVD present. Normal carotid impulses w/o bruits. Thyroid symmetrical with no goiter or nodules palpated. No lymphadenopathy.   CV: RRR, no m/r/g, no peripheral edema. Pulses intact, +2 bilaterally. No cyanosis, pallor or clubbing. PULMONARY:  Unlabored, regular breathing. End expiratory wheeze that clears with deep inspiration bilaterally. No accessory muscle use.  GI: BS present and normoactive. Soft, non-tender to palpation. No organomegaly or masses detected.  MSK: No erythema, warmth or tenderness. Cap refil <2 sec all extrem. No deformities or joint swelling noted.  Neuro: A/Ox3. No focal deficits noted.   Skin: Warm, no lesions or rashe Psych: Normal affect and behavior. Judgement and thought content appropriate.     Lab Results:  CBC    Component Value Date/Time   WBC 9.0  10/26/2021 1344   RBC 5.26 10/26/2021 1344   HGB 16.5 10/26/2021 1344   HCT 50.2 10/26/2021 1344   PLT 174 10/26/2021 1344   MCV 95.4 10/26/2021 1344   MCH 31.4 10/26/2021 1344   MCHC 32.9 10/26/2021 1344   RDW 13.2 10/26/2021 1344    BMET    Component Value Date/Time   NA 139 10/26/2021 1344   K 4.6 10/26/2021 1344   CL 105 10/26/2021 1344   CO2 29 10/26/2021 1344   GLUCOSE 101 (H) 10/26/2021 1344   BUN 12 10/26/2021 1344   CREATININE 1.18 10/26/2021 1344   CALCIUM 8.9 10/26/2021 1344   GFRNONAA >60 10/26/2021 1344    BNP No results found for: "BNP"   Imaging:  No results found.  Administration History     None          Latest Ref Rng & Units 11/18/2021   12:36 PM  PFT Results  FVC-Pre L 3.82   FVC-Predicted Pre % 99   FVC-Post L 3.78   FVC-Predicted Post % 98   Pre FEV1/FVC % % 51   Post FEV1/FCV % % 51   FEV1-Pre L 1.94   FEV1-Predicted Pre % 70   FEV1-Post L 1.94   DLCO uncorrected ml/min/mmHg 12.39   DLCO  [facility]% % 53   DLVA Predicted % 69   TLC L 9.89   TLC % Predicted % 153   RV % Predicted % 247     No results found for: "NITRICOXIDE"      Assessment & Plan:      Lung Nodule New nodules, largest, 6 mm nodule in the left lower lobe identified on the most recent CT scan from November 2024. Will need monitoring to ensure stability. Plan for repeat in 6 months per Dr. Myrlene Broker previous recommendations. Orders placed today.  - Order repeat CT scan in May - Schedule follow-up appointment with Dr. Delton Coombes in early June to review CT results and determine next steps  Chronic Obstructive Pulmonary Disease (COPD) COPD managed with Breztri twice daily and rescue inhaler used approximately twice a week. Compensated on current regimen. Has a chronic cough. Continue triple therapy regimen. Action plan in place. Continues to smoke, though has reduced from two packs a day to eight or nine cigarettes a day. Discussed the importance of smoking cessation and reviewed various cessation aids. He is currently on Wellbutrin for mood, which does help with smoking cravings.  - Continue Breztri twice daily - Use rescue inhaler as needed - Consider Mucinex for congestion, 1-2 times a day as needed  - Encourage smoking cessation   Follow-up - Schedule repeat CT scan end of May - Follow-up appointment with Dr. Corliss Blacker in early June to review CT results - Call the clinic if experiencing persistent symptoms or increased difficulty breathing.        Advised if symptoms do not improve or worsen, to please contact office for sooner follow up or seek emergency care.   I spent 32 minutes of dedicated to the care of this patient on the date of this encounter to include pre-visit review of records, face-to-face time with the patient discussing conditions above, post visit ordering of testing, clinical documentation with the electronic health record, making appropriate referrals as documented, and communicating necessary  findings to members of the patients care team.  Noemi Chapel, NP 07/17/2023  Pt aware and understands NP's role.

## 2023-07-17 NOTE — Patient Instructions (Addendum)
Continue Breztri 2 puffs Twice daily. Brush tongue and rinse mouth afterwards Continue Albuterol inhaler 2 puffs or 3 mL neb every 6 hours as needed for shortness of breath or wheezing. Notify if symptoms persist despite rescue inhaler/neb use.  Continue claritin 1 tab daily   Repeat CT chest in May - someone should contact you to schedule this. If you haven't heard something in April, call our office   Continue working on quitting smoking - can call the 1-800 QUITNOW line   Follow up beginning of June 2025 with Dr. Delton Coombes (new pt 30 min nodule slot). If symptoms do not improve or worsen, please contact office for sooner follow up or seek emergency care.

## 2023-07-18 ENCOUNTER — Inpatient Hospital Stay
Admission: RE | Admit: 2023-07-18 | Discharge: 2023-07-18 | Disposition: A | Discharge status: Home / Self Care | Payer: Self-pay | Source: Ambulatory Visit | Attending: Nurse Practitioner | Admitting: Nurse Practitioner

## 2023-07-18 ENCOUNTER — Other Ambulatory Visit: Payer: Self-pay

## 2023-07-18 DIAGNOSIS — J449 Chronic obstructive pulmonary disease, unspecified: Secondary | ICD-10-CM

## 2023-07-18 DIAGNOSIS — R918 Other nonspecific abnormal finding of lung field: Secondary | ICD-10-CM

## 2023-08-07 DIAGNOSIS — M810 Age-related osteoporosis without current pathological fracture: Secondary | ICD-10-CM | POA: Diagnosis not present

## 2023-08-07 DIAGNOSIS — M85852 Other specified disorders of bone density and structure, left thigh: Secondary | ICD-10-CM | POA: Diagnosis not present

## 2023-09-03 ENCOUNTER — Other Ambulatory Visit: Payer: Self-pay | Admitting: Pulmonary Disease

## 2023-09-04 NOTE — Telephone Encounter (Signed)
 Pharmacy requesting Rx, not mention in last visit note.

## 2023-10-11 DIAGNOSIS — J449 Chronic obstructive pulmonary disease, unspecified: Secondary | ICD-10-CM | POA: Diagnosis not present

## 2023-10-11 DIAGNOSIS — I10 Essential (primary) hypertension: Secondary | ICD-10-CM | POA: Diagnosis not present

## 2023-10-11 DIAGNOSIS — I700 Atherosclerosis of aorta: Secondary | ICD-10-CM | POA: Diagnosis not present

## 2023-10-11 DIAGNOSIS — K219 Gastro-esophageal reflux disease without esophagitis: Secondary | ICD-10-CM | POA: Diagnosis not present

## 2023-10-11 DIAGNOSIS — Z0000 Encounter for general adult medical examination without abnormal findings: Secondary | ICD-10-CM | POA: Diagnosis not present

## 2023-10-11 DIAGNOSIS — K760 Fatty (change of) liver, not elsewhere classified: Secondary | ICD-10-CM | POA: Diagnosis not present

## 2023-10-11 DIAGNOSIS — N182 Chronic kidney disease, stage 2 (mild): Secondary | ICD-10-CM | POA: Diagnosis not present

## 2023-10-11 DIAGNOSIS — E782 Mixed hyperlipidemia: Secondary | ICD-10-CM | POA: Diagnosis not present

## 2023-10-11 DIAGNOSIS — I25110 Atherosclerotic heart disease of native coronary artery with unstable angina pectoris: Secondary | ICD-10-CM | POA: Diagnosis not present

## 2023-11-07 ENCOUNTER — Ambulatory Visit (HOSPITAL_COMMUNITY)
Admission: RE | Admit: 2023-11-07 | Discharge: 2023-11-07 | Disposition: A | Discharge status: Home / Self Care | Source: Ambulatory Visit | Attending: Nurse Practitioner | Admitting: Nurse Practitioner

## 2023-11-07 DIAGNOSIS — R918 Other nonspecific abnormal finding of lung field: Secondary | ICD-10-CM | POA: Diagnosis not present

## 2023-11-07 DIAGNOSIS — J432 Centrilobular emphysema: Secondary | ICD-10-CM | POA: Diagnosis not present

## 2023-11-07 DIAGNOSIS — I700 Atherosclerosis of aorta: Secondary | ICD-10-CM | POA: Diagnosis not present

## 2023-11-17 ENCOUNTER — Ambulatory Visit: Payer: Self-pay | Admitting: Nurse Practitioner

## 2023-11-17 DIAGNOSIS — R918 Other nonspecific abnormal finding of lung field: Secondary | ICD-10-CM

## 2023-11-17 DIAGNOSIS — F17210 Nicotine dependence, cigarettes, uncomplicated: Secondary | ICD-10-CM

## 2023-12-19 DIAGNOSIS — H0100A Unspecified blepharitis right eye, upper and lower eyelids: Secondary | ICD-10-CM | POA: Diagnosis not present

## 2024-01-17 DIAGNOSIS — K219 Gastro-esophageal reflux disease without esophagitis: Secondary | ICD-10-CM | POA: Diagnosis not present

## 2024-01-17 DIAGNOSIS — I25110 Atherosclerotic heart disease of native coronary artery with unstable angina pectoris: Secondary | ICD-10-CM | POA: Diagnosis not present

## 2024-01-17 DIAGNOSIS — K760 Fatty (change of) liver, not elsewhere classified: Secondary | ICD-10-CM | POA: Diagnosis not present

## 2024-01-17 DIAGNOSIS — I700 Atherosclerosis of aorta: Secondary | ICD-10-CM | POA: Diagnosis not present

## 2024-01-17 DIAGNOSIS — J449 Chronic obstructive pulmonary disease, unspecified: Secondary | ICD-10-CM | POA: Diagnosis not present

## 2024-01-17 DIAGNOSIS — E782 Mixed hyperlipidemia: Secondary | ICD-10-CM | POA: Diagnosis not present

## 2024-01-17 DIAGNOSIS — I10 Essential (primary) hypertension: Secondary | ICD-10-CM | POA: Diagnosis not present

## 2024-01-17 DIAGNOSIS — N182 Chronic kidney disease, stage 2 (mild): Secondary | ICD-10-CM | POA: Diagnosis not present

## 2024-02-09 DIAGNOSIS — H524 Presbyopia: Secondary | ICD-10-CM | POA: Diagnosis not present

## 2024-02-11 DIAGNOSIS — M545 Low back pain, unspecified: Secondary | ICD-10-CM | POA: Diagnosis not present

## 2024-02-11 DIAGNOSIS — M62838 Other muscle spasm: Secondary | ICD-10-CM | POA: Diagnosis not present

## 2024-02-13 DIAGNOSIS — M545 Low back pain, unspecified: Secondary | ICD-10-CM | POA: Diagnosis not present

## 2024-02-13 DIAGNOSIS — M5459 Other low back pain: Secondary | ICD-10-CM | POA: Diagnosis not present

## 2024-02-14 DIAGNOSIS — M47816 Spondylosis without myelopathy or radiculopathy, lumbar region: Secondary | ICD-10-CM | POA: Diagnosis not present

## 2024-02-14 DIAGNOSIS — I878 Other specified disorders of veins: Secondary | ICD-10-CM | POA: Diagnosis not present

## 2024-02-14 DIAGNOSIS — M47814 Spondylosis without myelopathy or radiculopathy, thoracic region: Secondary | ICD-10-CM | POA: Diagnosis not present

## 2024-02-14 DIAGNOSIS — M545 Low back pain, unspecified: Secondary | ICD-10-CM | POA: Diagnosis not present

## 2024-02-14 DIAGNOSIS — M546 Pain in thoracic spine: Secondary | ICD-10-CM | POA: Diagnosis not present

## 2024-02-14 DIAGNOSIS — Z981 Arthrodesis status: Secondary | ICD-10-CM | POA: Diagnosis not present

## 2024-02-14 DIAGNOSIS — M5126 Other intervertebral disc displacement, lumbar region: Secondary | ICD-10-CM | POA: Diagnosis not present

## 2024-03-05 DIAGNOSIS — M5418 Radiculopathy, sacral and sacrococcygeal region: Secondary | ICD-10-CM | POA: Diagnosis not present

## 2024-03-05 DIAGNOSIS — M5459 Other low back pain: Secondary | ICD-10-CM | POA: Diagnosis not present

## 2024-03-15 ENCOUNTER — Other Ambulatory Visit: Payer: Self-pay | Admitting: Nurse Practitioner

## 2024-03-20 DIAGNOSIS — N134 Hydroureter: Secondary | ICD-10-CM | POA: Diagnosis not present

## 2024-03-20 DIAGNOSIS — K5730 Diverticulosis of large intestine without perforation or abscess without bleeding: Secondary | ICD-10-CM | POA: Diagnosis not present

## 2024-03-20 DIAGNOSIS — N132 Hydronephrosis with renal and ureteral calculous obstruction: Secondary | ICD-10-CM | POA: Diagnosis not present

## 2024-03-25 IMAGING — CT CT ABD-PELV W/ CM
2 of 5 series · 16 of 46 positions shown, 18 images · IV contrast (Omnipaque or Isovue)
Comparison: Chest x-ray 10/26/2021, chest CT 08/03/2021

CLINICAL DATA: Generalized abdominal pain with nausea

EXAM:
CT ABDOMEN AND PELVIS WITH CONTRAST
TECHNIQUE: Multidetector CT imaging of the abdomen and pelvis was performed
using the standard protocol following bolus administration of
intravenous contrast.

[Series 2: axial st · axial · 0.87mm/px · z∈[+735,+1160]mm · 13 of 97 slices shown, 15 images]
[im 6/97  soft-tissue]
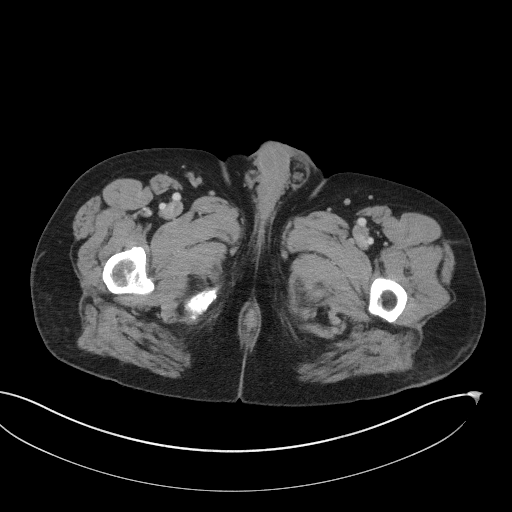
[im 6/97  bone]
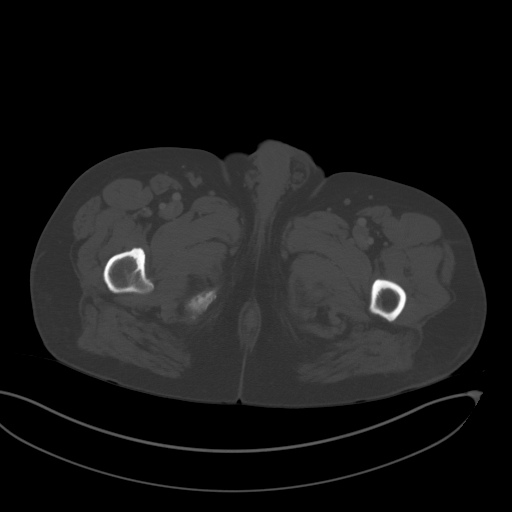
[im 16/97  soft-tissue]
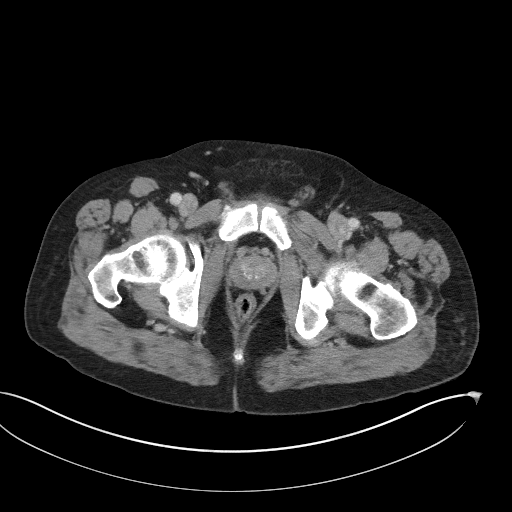
[im 21/97  soft-tissue]
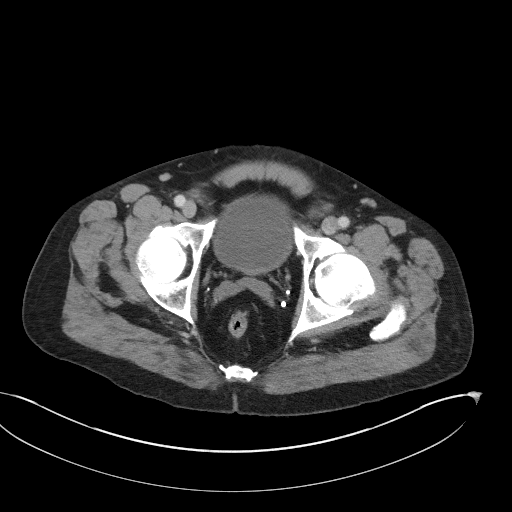
[im 26/97  soft-tissue]
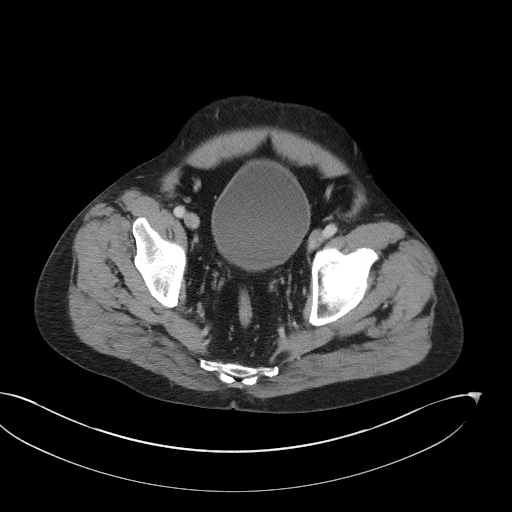
[im 36/97  soft-tissue]
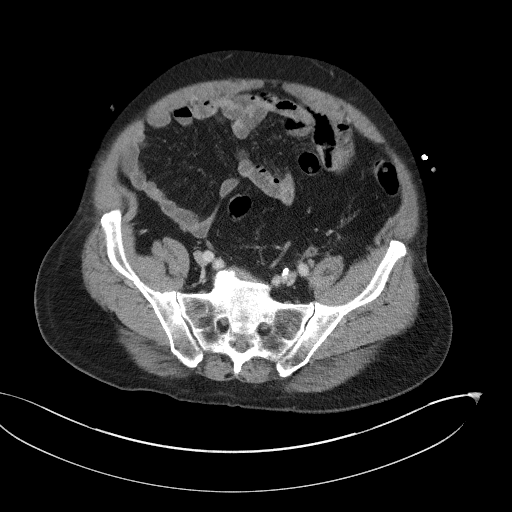
[im 41/97  soft-tissue]
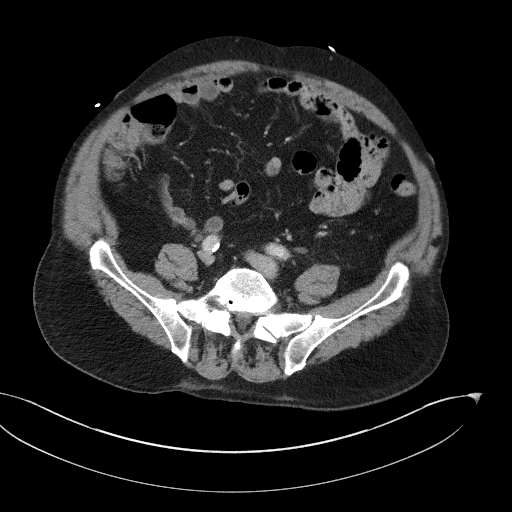
[im 51/97  soft-tissue]
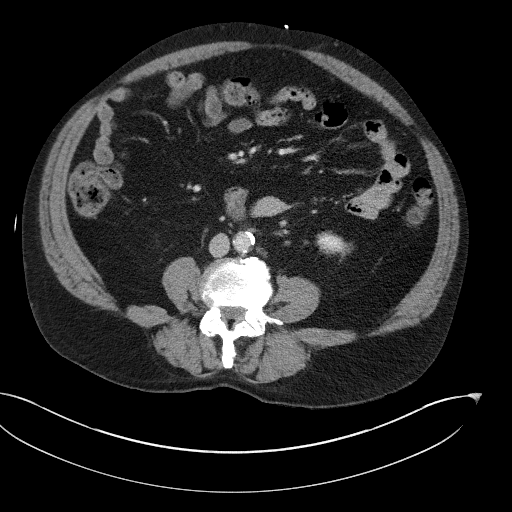
[im 56/97  soft-tissue]
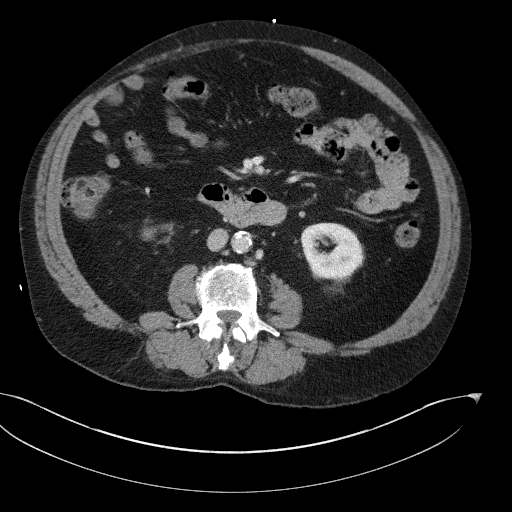
[im 61/97  soft-tissue]
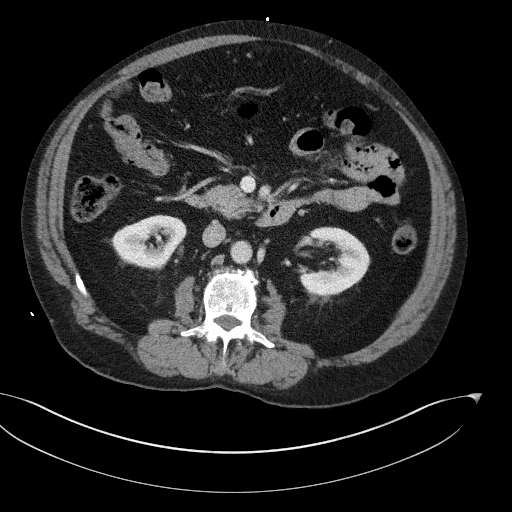
[im 61/97  bone]
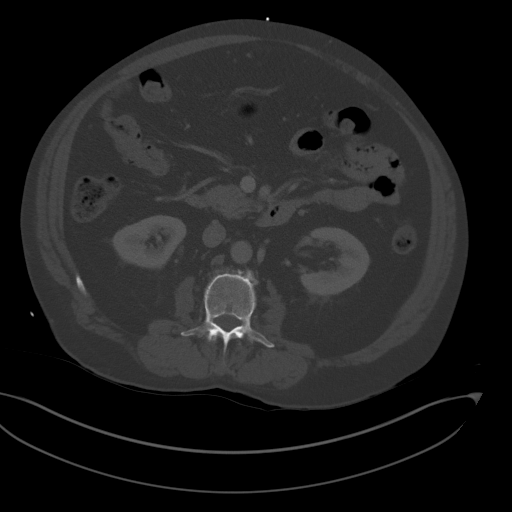
[im 71/97  soft-tissue]
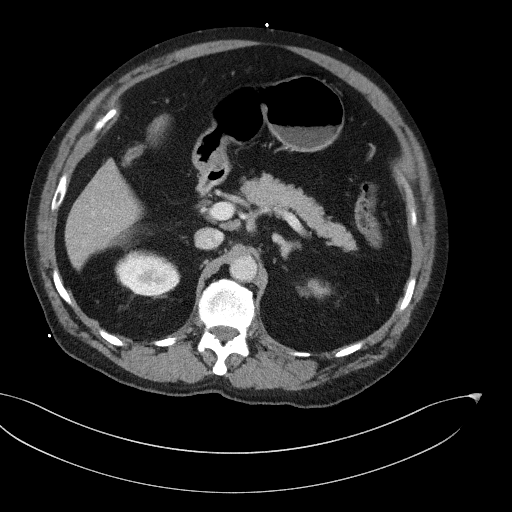
[im 76/97  soft-tissue]
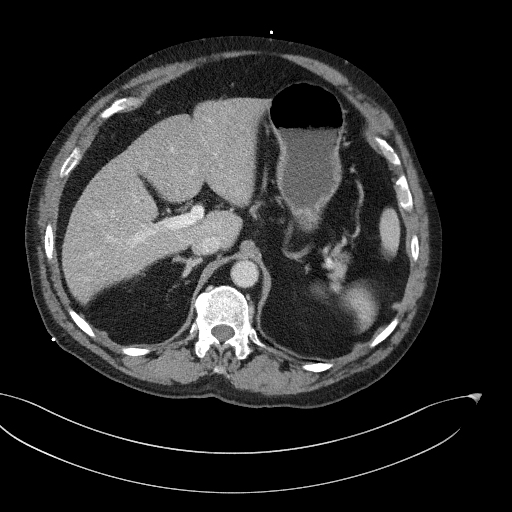
[im 81/97  soft-tissue]
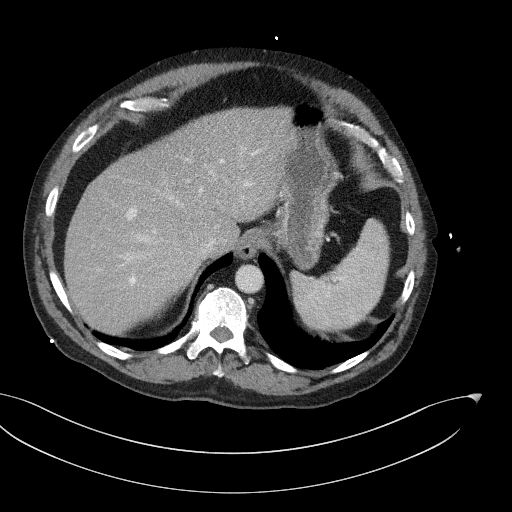
[im 91/97  soft-tissue]
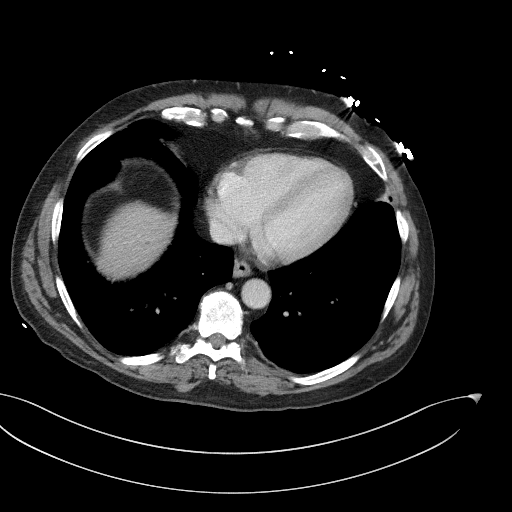

[Series 5: coronal st · coronal · 0.84mm/px · 3 of 122 slices shown]
[im 41/122  soft-tissue]
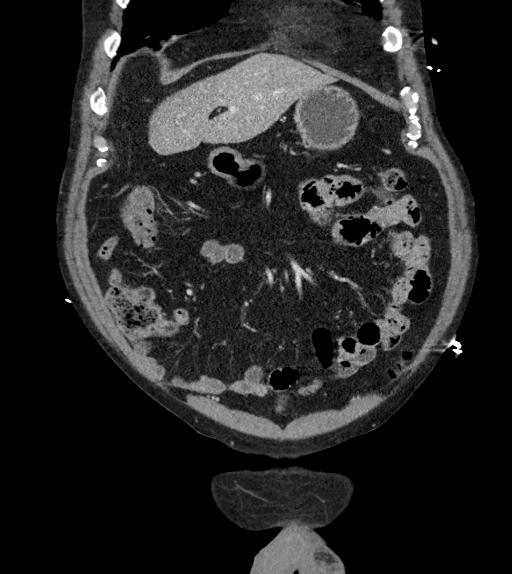
[im 54/122  soft-tissue]
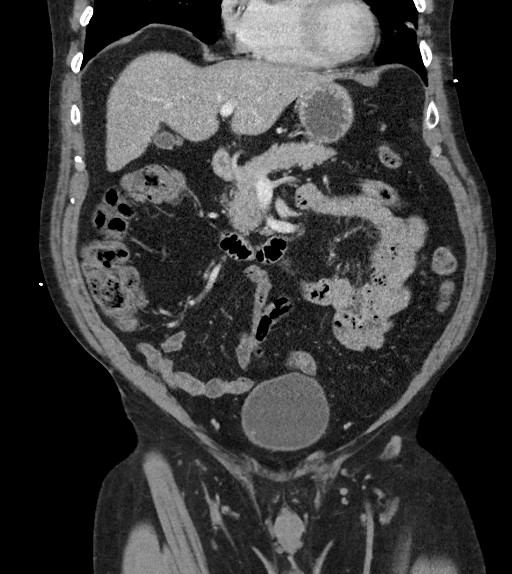
[im 68/122  soft-tissue]
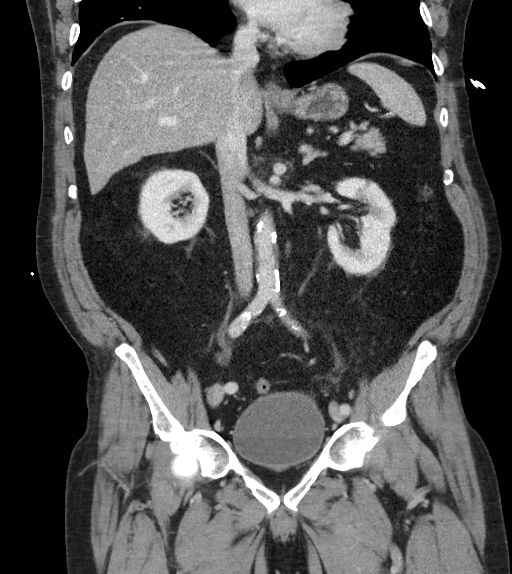

[16 of 46 positions shown; findings below may reference images not displayed]

RADIATION DOSE REDUCTION: This exam was performed according to the
departmental dose-optimization program which includes automated
exposure control, adjustment of the mA and/or kV according to
patient size and/or use of iterative reconstruction technique.

CONTRAST:  100mL OMNIPAQUE IOHEXOL 300 MG/ML  SOLN
FINDINGS: Lower chest: Lung bases demonstrate punctate central lobular nodules
in the right middle lobe and right greater than left lung base,
suggestive of atypical infection, see chest CT 08/03/2021. No
consolidation or effusion.

Hepatobiliary: No focal liver abnormality is seen. No gallstones,
gallbladder wall thickening, or biliary dilatation.

Pancreas: Unremarkable. No pancreatic ductal dilatation or
surrounding inflammatory changes.

Spleen: Normal in size without focal abnormality.

Adrenals/Urinary Tract: Adrenal glands are normal. Kidneys show no
hydronephrosis. Subcentimeter hypodensity upper pole left kidney too
small to further characterize. The bladder is normal

Stomach/Bowel: The stomach is nonenlarged. No dilated small bowel.
No acute bowel wall thickening.

Vascular/Lymphatic: Moderate aortic atherosclerosis. No aneurysm. No
suspicious lymph nodes

Reproductive: Prostate is unremarkable.

Other: Negative for pelvic effusion or free air

Musculoskeletal: No acute osseous abnormality. There are
degenerative changes of the lumbar spine
IMPRESSION: 1. No CT evidence for acute intra-abdominal or pelvic abnormality.

## 2024-04-24 DIAGNOSIS — I700 Atherosclerosis of aorta: Secondary | ICD-10-CM | POA: Diagnosis not present

## 2024-04-24 DIAGNOSIS — K219 Gastro-esophageal reflux disease without esophagitis: Secondary | ICD-10-CM | POA: Diagnosis not present

## 2024-04-24 DIAGNOSIS — I25110 Atherosclerotic heart disease of native coronary artery with unstable angina pectoris: Secondary | ICD-10-CM | POA: Diagnosis not present

## 2024-04-24 DIAGNOSIS — I10 Essential (primary) hypertension: Secondary | ICD-10-CM | POA: Diagnosis not present

## 2024-04-24 DIAGNOSIS — E782 Mixed hyperlipidemia: Secondary | ICD-10-CM | POA: Diagnosis not present

## 2024-04-24 DIAGNOSIS — J449 Chronic obstructive pulmonary disease, unspecified: Secondary | ICD-10-CM | POA: Diagnosis not present

## 2024-04-24 DIAGNOSIS — N182 Chronic kidney disease, stage 2 (mild): Secondary | ICD-10-CM | POA: Diagnosis not present

## 2024-04-24 DIAGNOSIS — M5459 Other low back pain: Secondary | ICD-10-CM | POA: Diagnosis not present

## 2024-04-24 DIAGNOSIS — M5418 Radiculopathy, sacral and sacrococcygeal region: Secondary | ICD-10-CM | POA: Diagnosis not present

## 2024-04-24 DIAGNOSIS — K760 Fatty (change of) liver, not elsewhere classified: Secondary | ICD-10-CM | POA: Diagnosis not present

## 2024-06-09 ENCOUNTER — Other Ambulatory Visit: Payer: Self-pay | Admitting: Nurse Practitioner
